# Patient Record
Sex: Female | Born: 1990 | Race: White | Hispanic: No | Marital: Married | Smoking: Former smoker
Health system: Southern US, Community
[De-identification: ages and names within clinical notes are randomized; demographics above are authoritative.]

## PROBLEM LIST (undated history)

## (undated) DIAGNOSIS — I10 Essential (primary) hypertension: Secondary | ICD-10-CM

## (undated) DIAGNOSIS — J45909 Unspecified asthma, uncomplicated: Secondary | ICD-10-CM

---
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
## 2011-01-29 ENCOUNTER — Emergency Department: Payer: Self-pay | Admitting: *Deleted

## 2012-04-25 ENCOUNTER — Emergency Department: Payer: Self-pay | Admitting: Emergency Medicine

## 2012-04-26 LAB — BASIC METABOLIC PANEL
Anion Gap: 7 (ref 7–16)
Co2: 27 mmol/L (ref 21–32)
Creatinine: 0.66 mg/dL (ref 0.60–1.30)
EGFR (African American): >60
EGFR (Non-African Amer.): >60
Glucose: 98 mg/dL (ref 65–99)
Sodium: 137 mmol/L (ref 136–145)

## 2012-09-16 IMAGING — US ABDOMEN ULTRASOUND LIMITED
1 series · 17 of 25 positions shown · non-contrast
Comparison: none

REASON FOR EXAM: RUQ pain, elevated lipase
COMMENTS:   Body Site: GB and Fossa, CBD, Head of Pancreas

PROCEDURE:     US  - US ABDOMEN LIMITED SURVEY  - January 29, 2011  [DATE]
RESULT:     Right upper quadrant abdominal ultrasound dated 01/29/2011.

[Series 1: abdomen ultrasound limited · 17 of 41 slices shown]
[im 1/41]
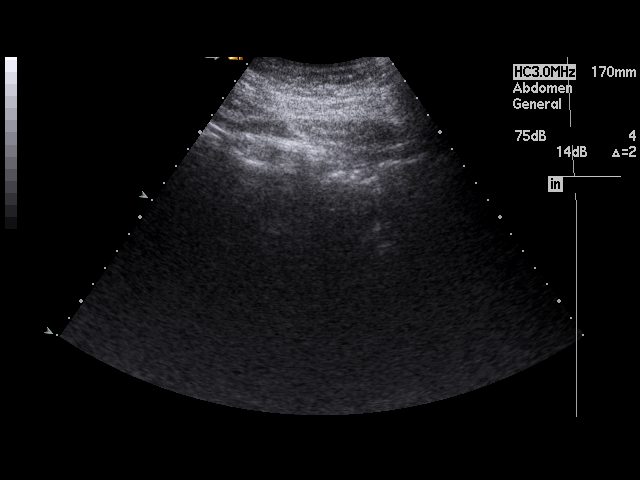
[im 4/41]
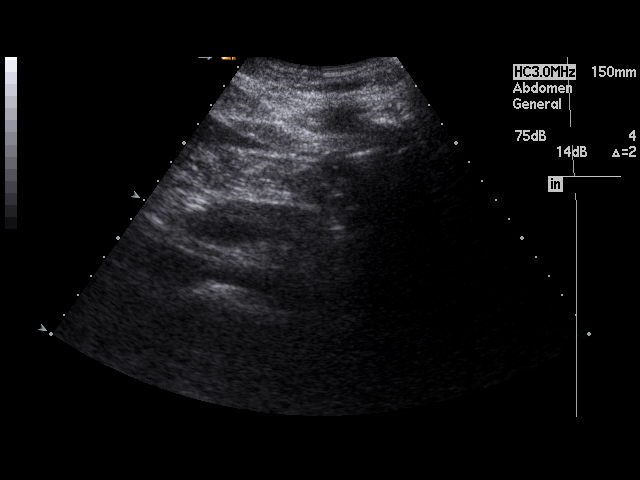
[im 6/41]
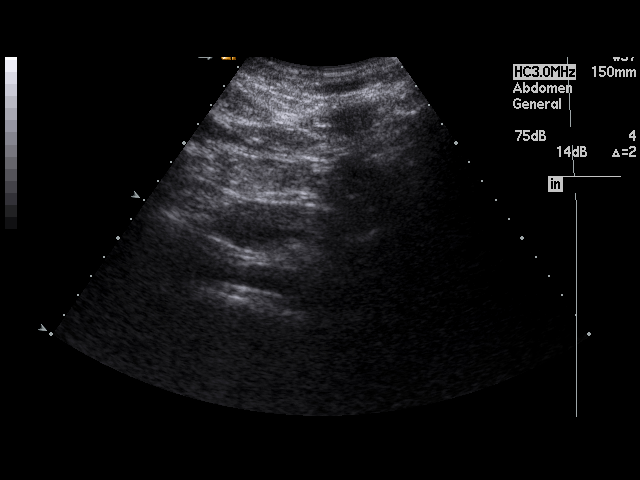
[im 9/41]
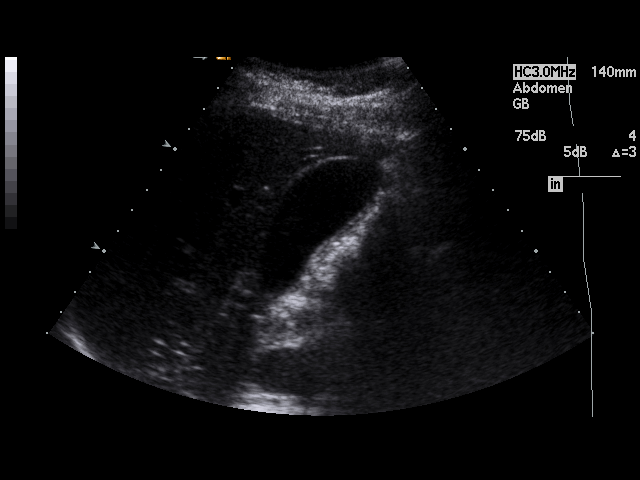
[im 11/41]
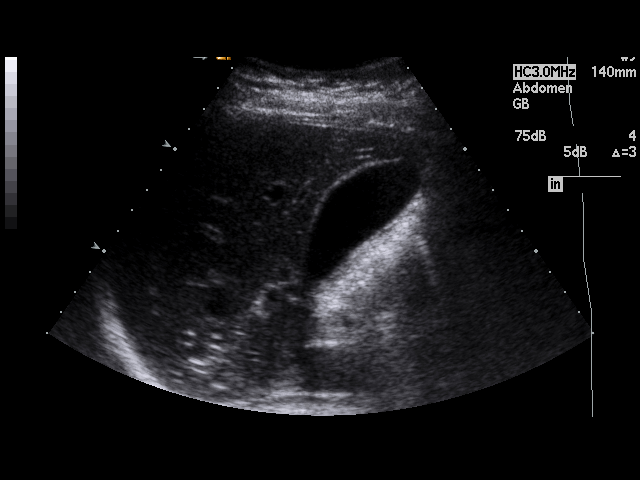
[im 14/41]
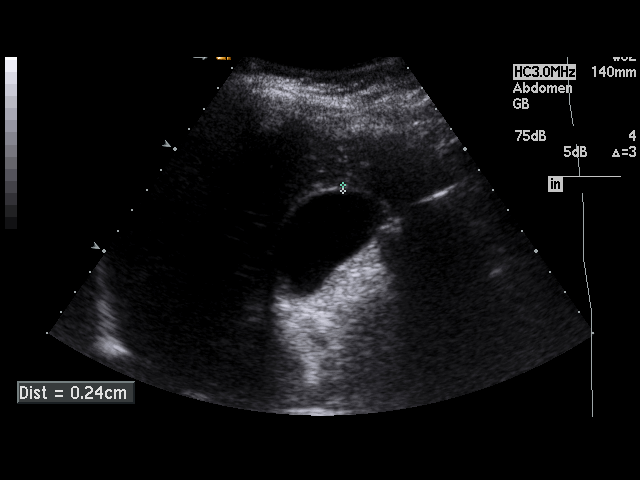
[im 16/41]
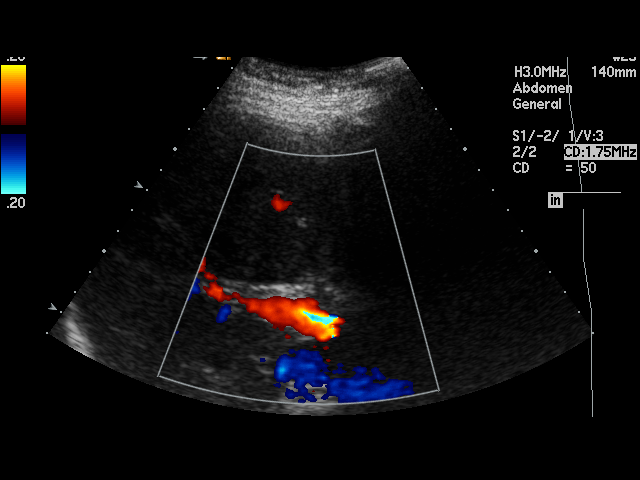
[im 19/41]
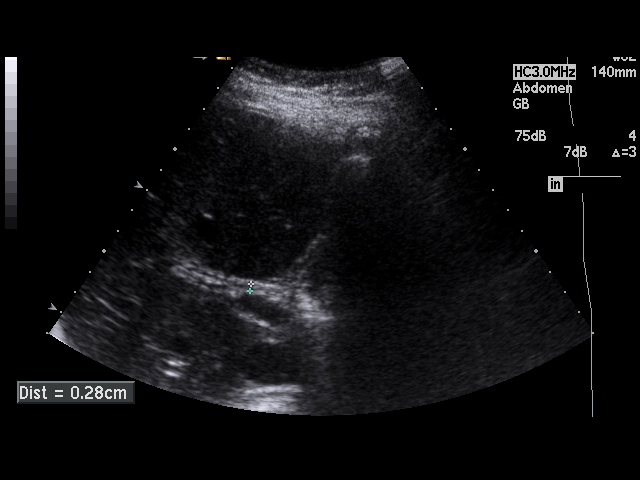
[im 21/41]
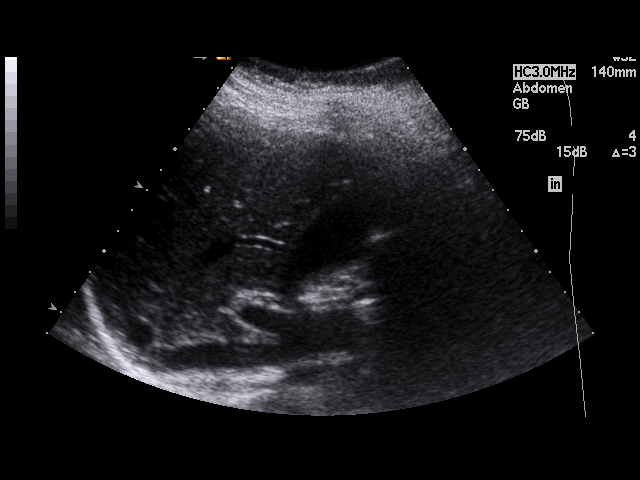
[im 22/41]
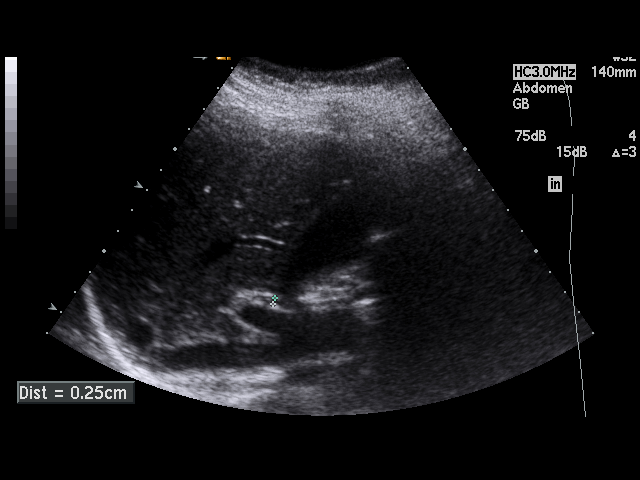
[im 26/41]
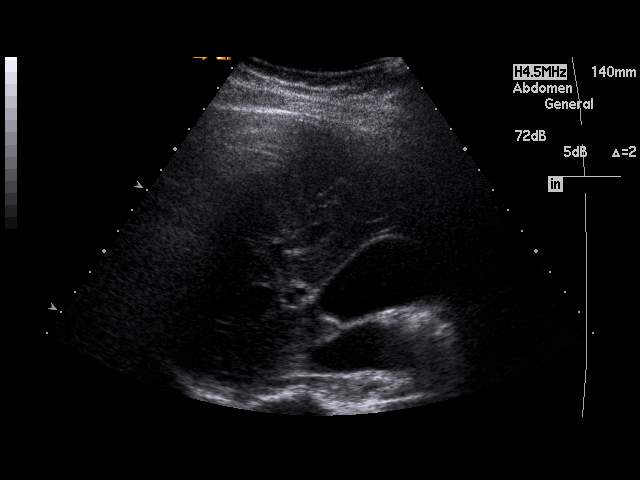
[im 27/41]
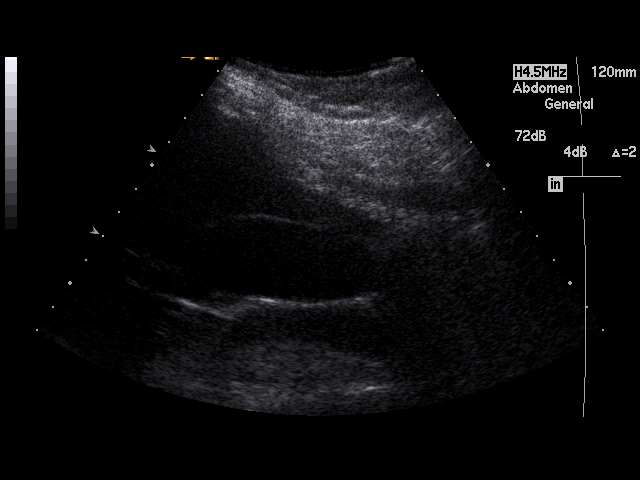
[im 31/41]
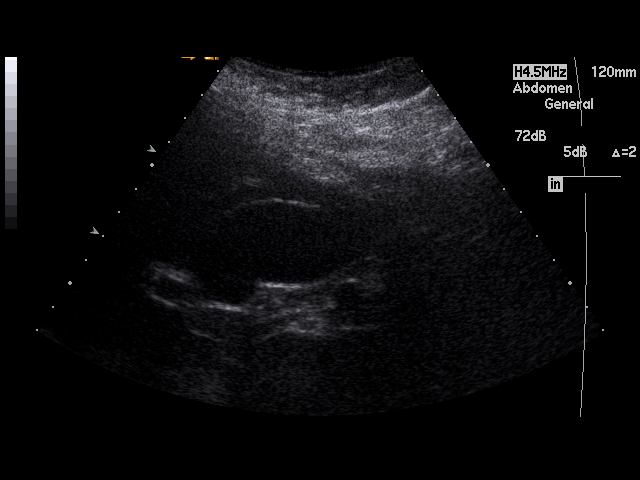
[im 32/41]
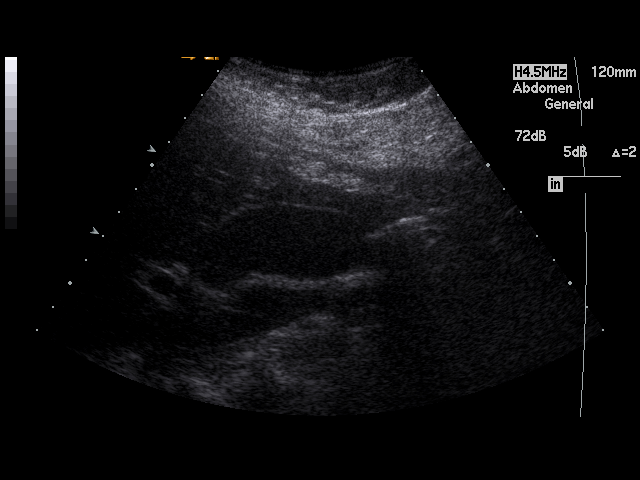
[im 36/41]
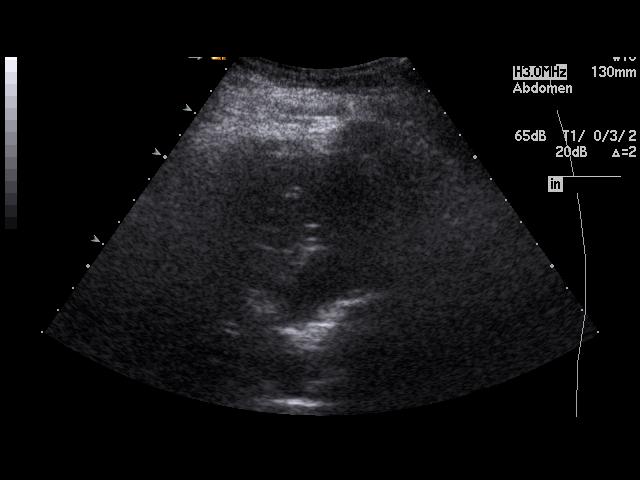
[im 37/41]
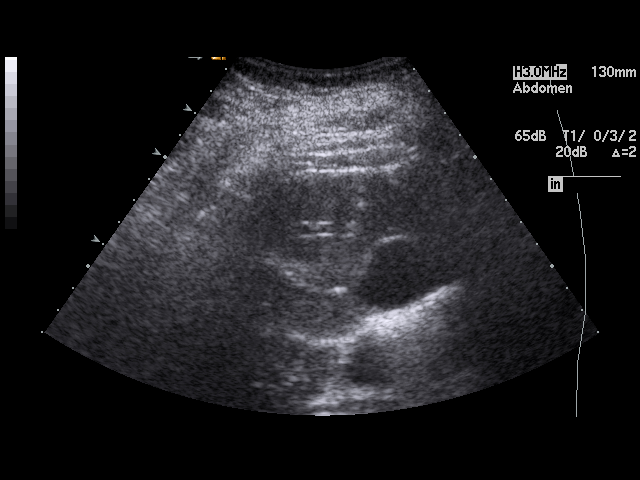
[im 41/41]
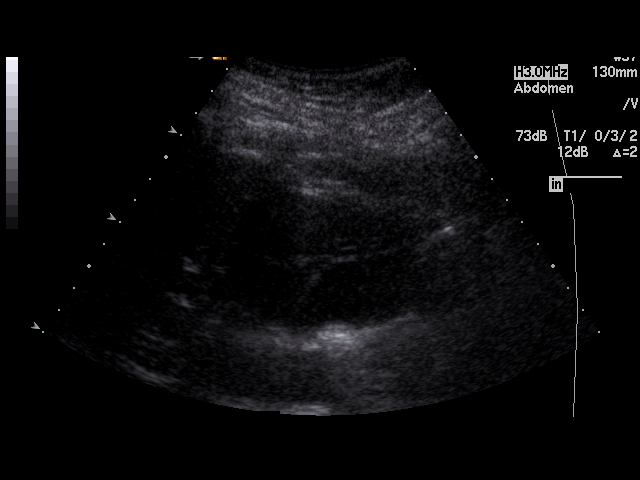

[17 of 25 positions shown; findings below may reference images not displayed]

FINDINGS: Gallbladder fossa demonstrates no evidence of pericholecystic
fluid, gallstones, nor sludging. There is no evidence of a sonographic
Murphy's sign. The gallbladder wall thickness is 2.4 mm and the common bile
duct measures 2.8 mm in diameter. The pancreas is partially visualized and
the head is unremarkable.
IMPRESSION: Unremarkable a right quadrant ultrasound.

## 2013-01-29 ENCOUNTER — Emergency Department: Payer: Self-pay | Admitting: Emergency Medicine

## 2013-01-29 LAB — CBC
HCT: 39.3 % (ref 35.0–47.0)
HGB: 13.3 g/dL (ref 12.0–16.0)
MCHC: 33.9 g/dL (ref 32.0–36.0)
Platelet: 312 10*3/uL (ref 150–440)
RBC: 4.6 10*6/uL (ref 3.80–5.20)
WBC: 9.7 10*3/uL (ref 3.6–11.0)

## 2013-03-11 ENCOUNTER — Emergency Department: Payer: Self-pay | Admitting: Emergency Medicine

## 2014-06-15 ENCOUNTER — Ambulatory Visit
Admit: 2014-06-15 | Disposition: A | Discharge status: Home / Self Care | Payer: Self-pay | Attending: Unknown Physician Specialty | Admitting: Unknown Physician Specialty

## 2014-06-29 ENCOUNTER — Emergency Department: Admit: 2014-06-29 | Disposition: A | Discharge status: Home / Self Care | Payer: Self-pay | Admitting: Student

## 2015-11-24 ENCOUNTER — Emergency Department
Admission: EM | Admit: 2015-11-24 | Discharge: 2015-11-24 | Disposition: A | Discharge status: Home / Self Care | Payer: Managed Care, Other (non HMO) | Attending: Emergency Medicine | Admitting: Emergency Medicine

## 2015-11-24 ENCOUNTER — Encounter: Payer: Self-pay | Admitting: Emergency Medicine

## 2015-11-24 DIAGNOSIS — Y999 Unspecified external cause status: Secondary | ICD-10-CM | POA: Insufficient documentation

## 2015-11-24 DIAGNOSIS — Y929 Unspecified place or not applicable: Secondary | ICD-10-CM | POA: Diagnosis not present

## 2015-11-24 DIAGNOSIS — Y939 Activity, unspecified: Secondary | ICD-10-CM | POA: Diagnosis not present

## 2015-11-24 DIAGNOSIS — W1809XA Striking against other object with subsequent fall, initial encounter: Secondary | ICD-10-CM | POA: Insufficient documentation

## 2015-11-24 DIAGNOSIS — S0990XA Unspecified injury of head, initial encounter: Secondary | ICD-10-CM | POA: Diagnosis not present

## 2015-11-24 NOTE — ED Triage Notes (Signed)
Patient reports fell and hit head on the wall.  Denies loss of consciousness.

## 2015-11-24 NOTE — ED Provider Notes (Signed)
[facility] Emergency Department Provider Note   ____________________________________________    I have reviewed the triage vital signs and the nursing notes.   HISTORY  Chief Complaint Head Injury     HPI Gwendolyn Barnes is a 25 y.o. female who presents with complaint of a head injury. Patient reports she was angry and lost her balance and hit the top of her head against the glass. She denies LOC. She reports she briefly felt "woozy". No change in vision. No focal deficits. No nausea or vomiting. This occurred nearly 5 hours ago. She reports she feels quite well currently   No past medical history on file.  There are no active problems to display for this patient.   No past surgical history on file.  Prior to Admission medications   Not on File     Allergies Review of patient's allergies indicates no known allergies.  No family history on file.  Social History Social History  Substance Use Topics  . Smoking status: Not on file  . Smokeless tobacco: Not on file  . Alcohol use Not on file  Denies alcohol use today, no smoking  Review of Systems  Constitutional: No dizziness  ENT: No neck pain   Gastrointestinal: No nausea, no vomiting.    Musculoskeletal: Negative for back pain. Skin: Negative for abrasion Neurological: Negative for headaches     ____________________________________________   PHYSICAL EXAM:  VITAL SIGNS: ED Triage Vitals  Enc Vitals Group     BP 11/24/15 2259 (!) 141/85     Pulse Rate 11/24/15 2258 (!) 108     Resp 11/24/15 2258 20     Temp 11/24/15 2258 98.5 F (36.9 C)     Temp Source 11/24/15 2258 Oral     SpO2 11/24/15 2258 100 %     Weight 11/24/15 2256 250 lb (113.4 kg)     Height 11/24/15 2256 5\' 5"  (1.651 m)     Head Circumference --      Peak Flow --      Pain Score 11/24/15 2256 8     Pain Loc --      Pain Edu? --      Excl. in GC? --      Constitutional: Alert and oriented. No  acute distress. Pleasant and interactive Eyes: Conjunctivae are normal. PERRLA, EOMI Head: Small area of swelling on the top of the head. No bony abnormality. Nose: No congestion/rhinnorhea. Mouth/Throat: Mucous membranes are moist.   Cardiovascular: Normal rate, regular rhythm.  Respiratory: Normal respiratory effort.  No retractions. Genitourinary: deferred Musculoskeletal: No cervical tenderness to palpation. Full range of motion of the neck. No vertebral tenderness to palpation, no pain in the back. Neurologic:  Normal speech and language. No gross focal neurologic deficits are appreciated.  Normal strength in all extremities. Skin:  Skin is warm, dry and intact. No rash noted.   ____________________________________________   LABS (all labs ordered are listed, but only abnormal results are displayed)  Labs Reviewed - No data to display ____________________________________________  EKG   ____________________________________________  RADIOLOGY  None ____________________________________________   PROCEDURES  Procedure(s) performed: No    Critical Care performed: No ____________________________________________   INITIAL IMPRESSION / ASSESSMENT AND PLAN / ED COURSE  Pertinent labs & imaging results that were available during my care of the patient were reviewed by me and considered in my medical decision making (see chart for details).  Patient well-appearing and in no distress. She appears in suffered  minor head injury with small contusion to the top of the scalp. No imaging necessary at this time. She is neurologically intact and well appearing. Return precautions discussed.   ____________________________________________   FINAL CLINICAL IMPRESSION(S) / ED DIAGNOSES  Final diagnoses:  Minor head injury, initial encounter      NEW MEDICATIONS STARTED DURING THIS VISIT:  There are no discharge medications for this patient.    Note:  This document was  prepared using Dragon voice recognition software and may include unintentional dictation errors.    Jene Everyobert Chyrl Elwell, MD 11/24/15 [id number]

## 2016-02-01 IMAGING — US ABDOMEN ULTRASOUND LIMITED
1 series · 14 of 25 positions shown · non-contrast
Comparison: 01/29/2011

CLINICAL DATA: Right upper quadrant pain for 3.5 weeks. History of
alcohol abuse.

EXAM:
US ABDOMEN LIMITED - RIGHT UPPER QUADRANT

[Series 1: abdomen ultrasound limited · 0.30mm/px · 14 of 44 slices shown]
[im 1/44]
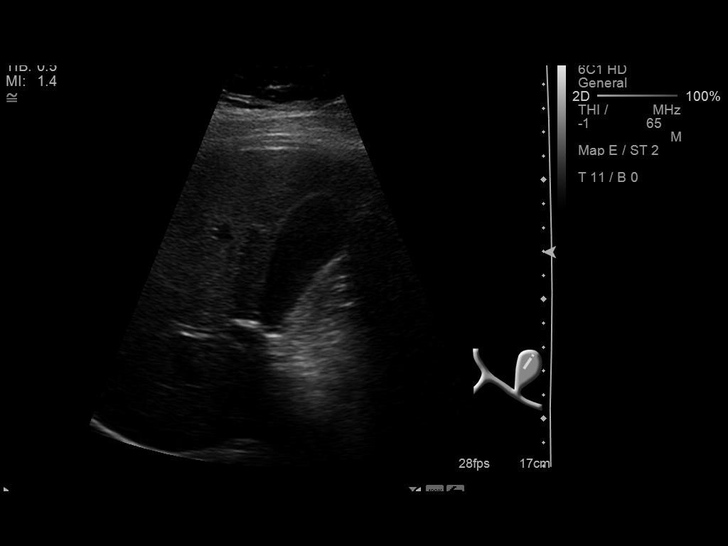
[im 4/44]
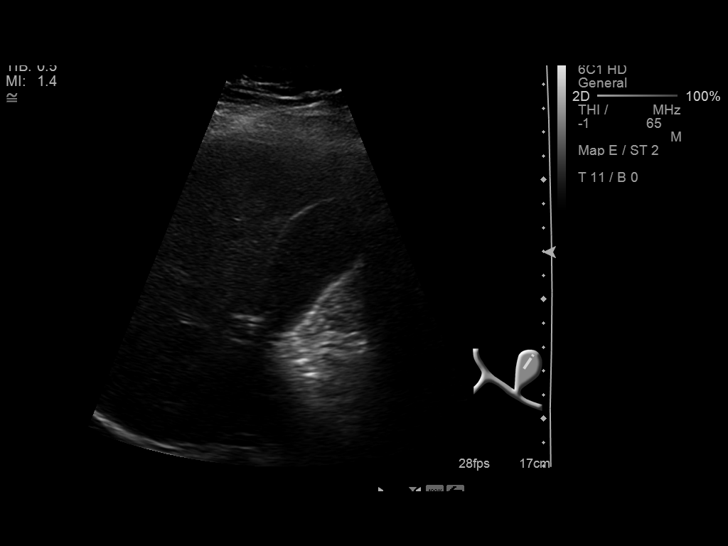
[im 8/44]
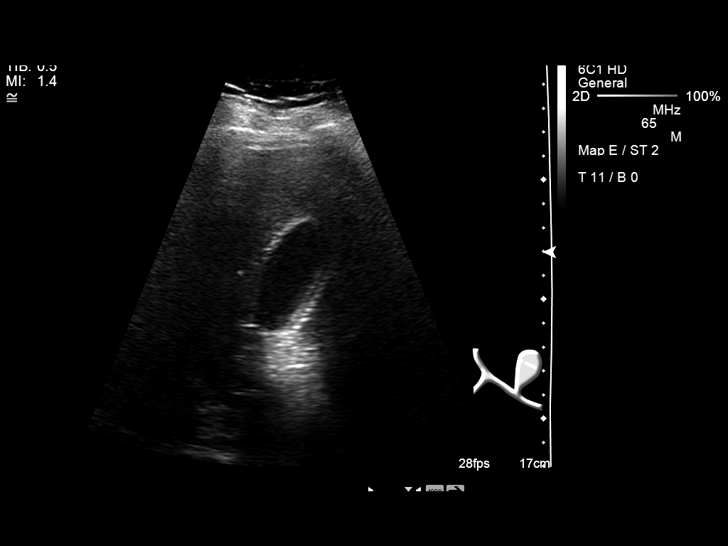
[im 11/44]
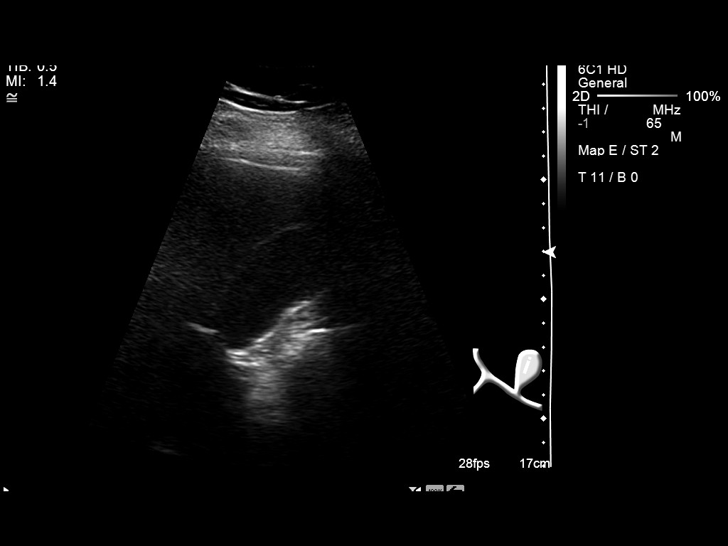
[im 15/44]
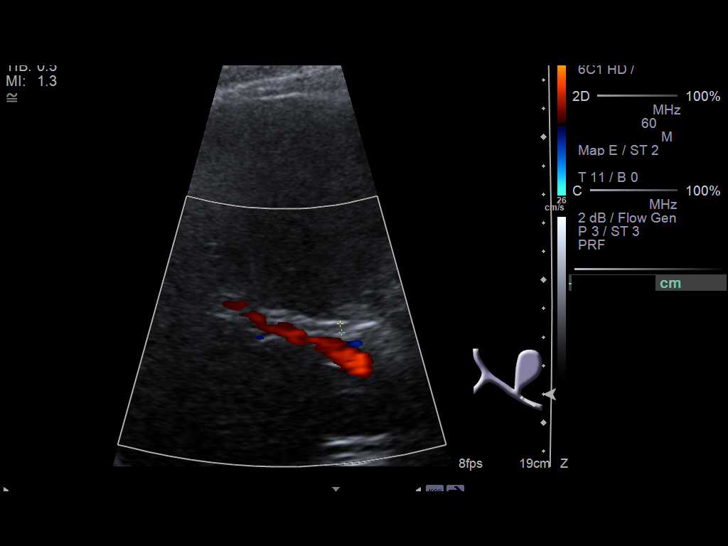
[im 17/44]
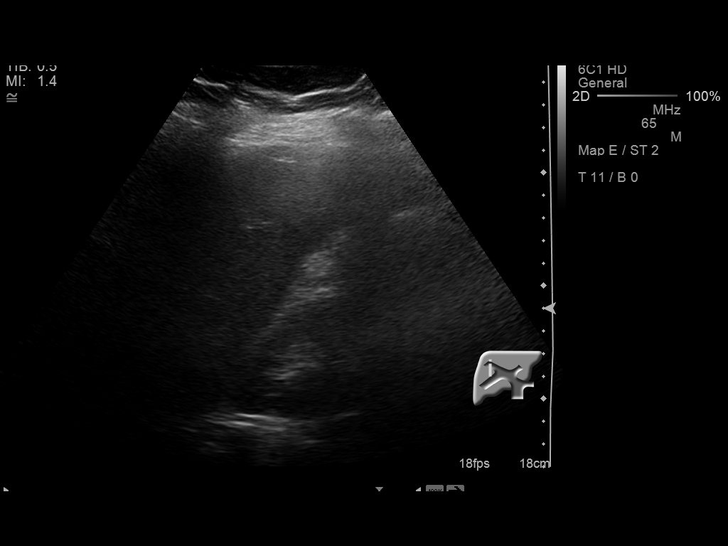
[im 20/44]
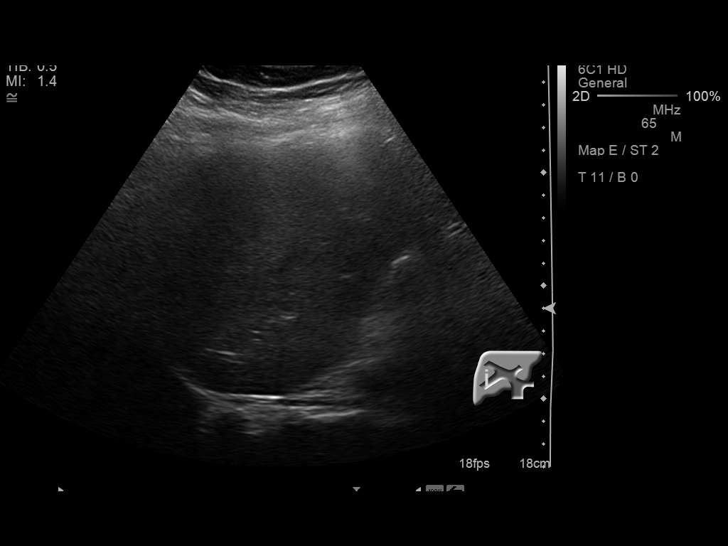
[im 24/44]
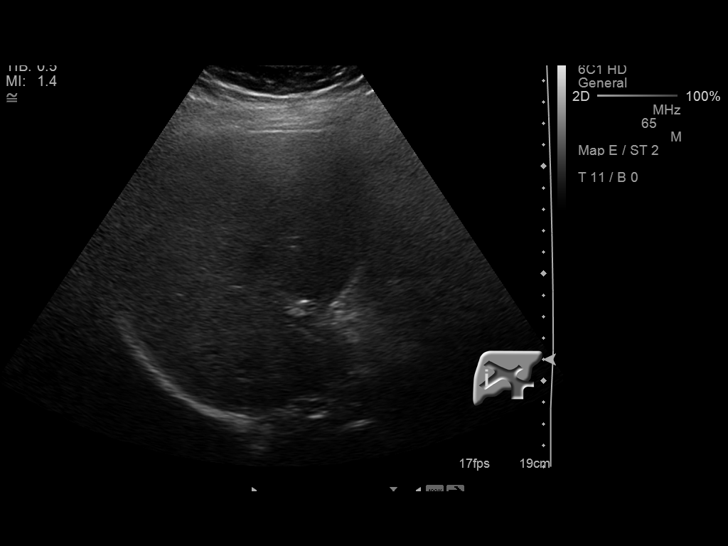
[im 27/44]
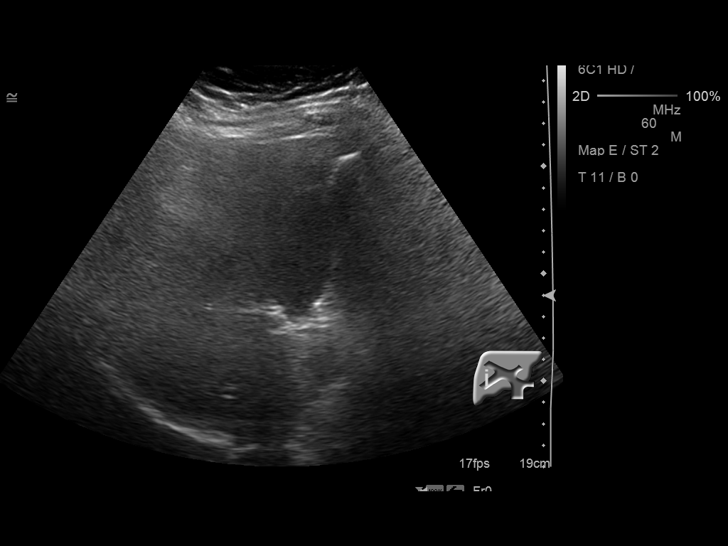
[im 29/44]
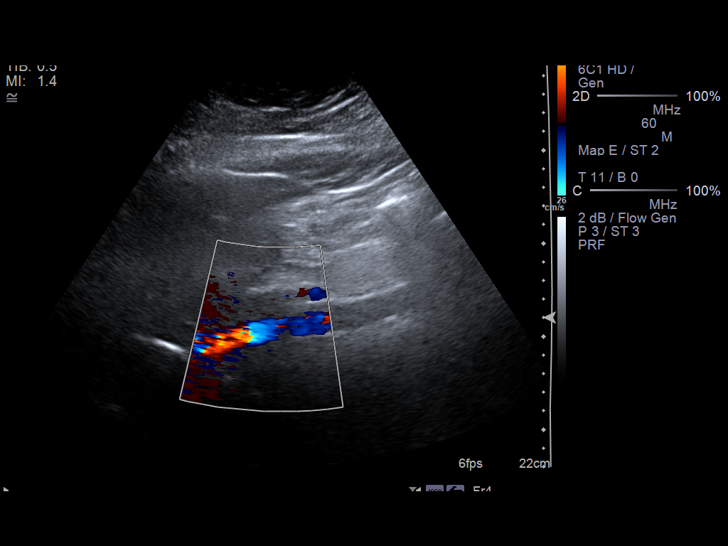
[im 33/44]
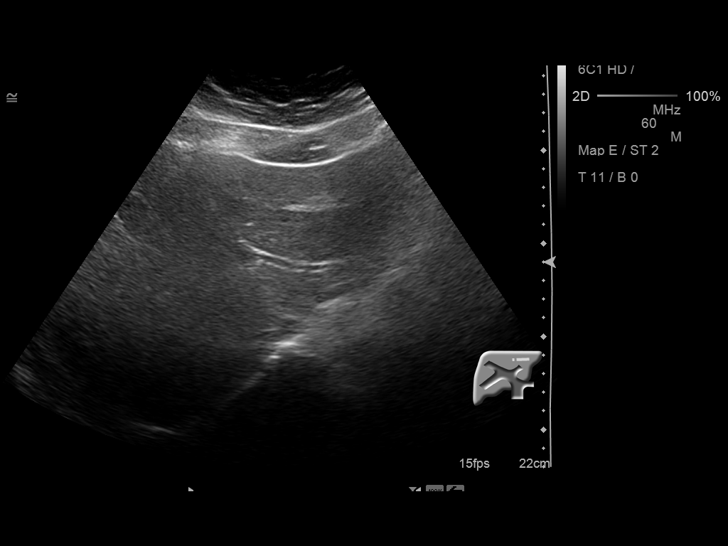
[im 36/44]
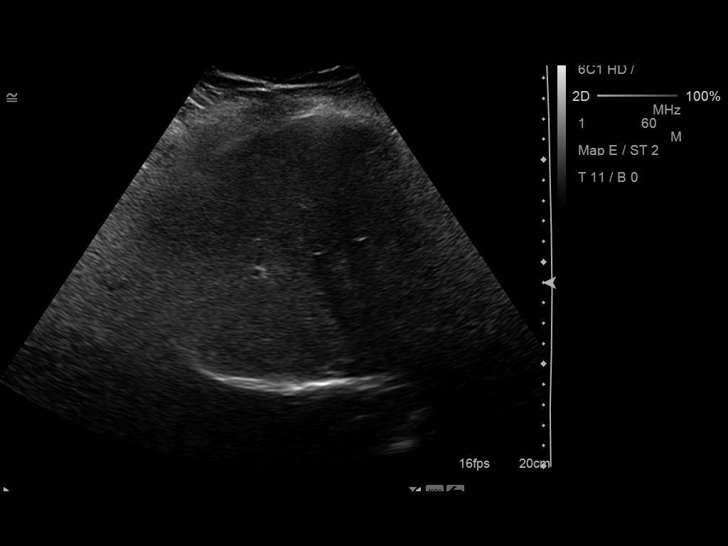
[im 40/44]
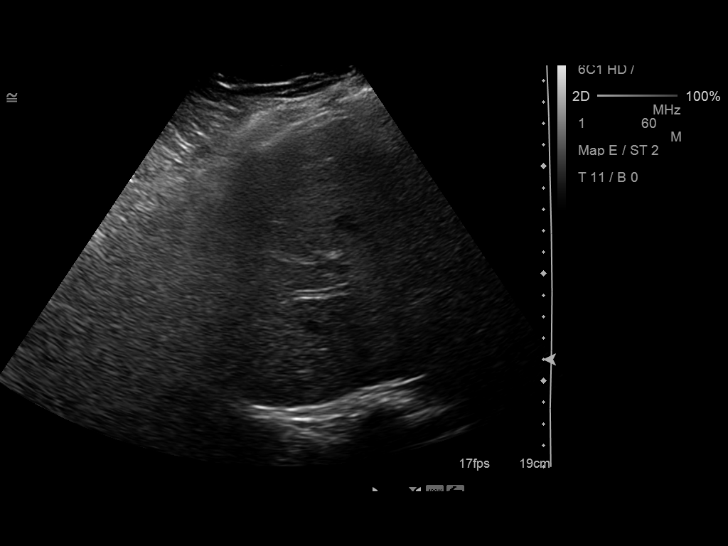
[im 44/44]
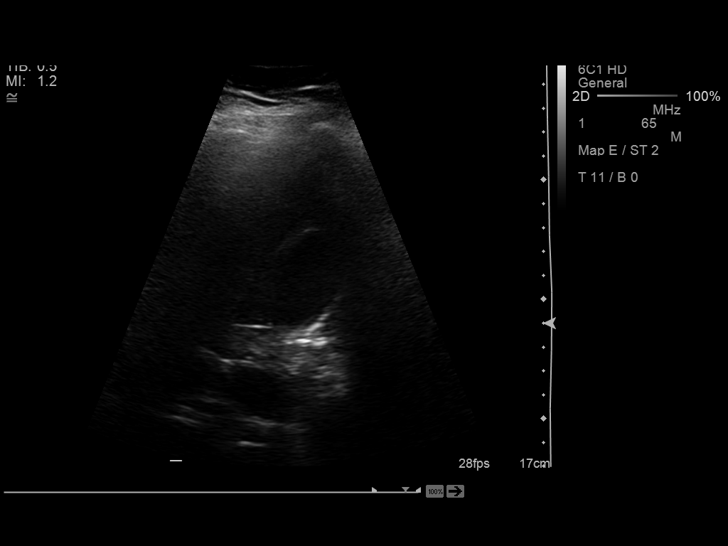

[14 of 25 positions shown; findings below may reference images not displayed]

FINDINGS: Examination was mildly limited by patient body habitus.

Gallbladder:

No gallstones or wall thickening visualized. No sonographic Murphy
sign noted.

Common bile duct:

Diameter: 3 mm

Liver:

No focal lesion identified. Within normal limits in parenchymal
echogenicity.
IMPRESSION: No abnormality identified.

## 2016-02-15 IMAGING — CR DG ANKLE COMPLETE 3+V*L*
1 series · 3 of 3 positions shown · non-contrast
Comparison: None.

CLINICAL DATA: Patient stepped down from a curb today and twisted
her left ankle. Patient has pain at the lateral malleolus with
swelling.

EXAM:
LEFT ANKLE COMPLETE - 3+ VIEW

[Series 1: ap · 0.17mm/px · 3 of 3 slices shown]
[im 1/3]
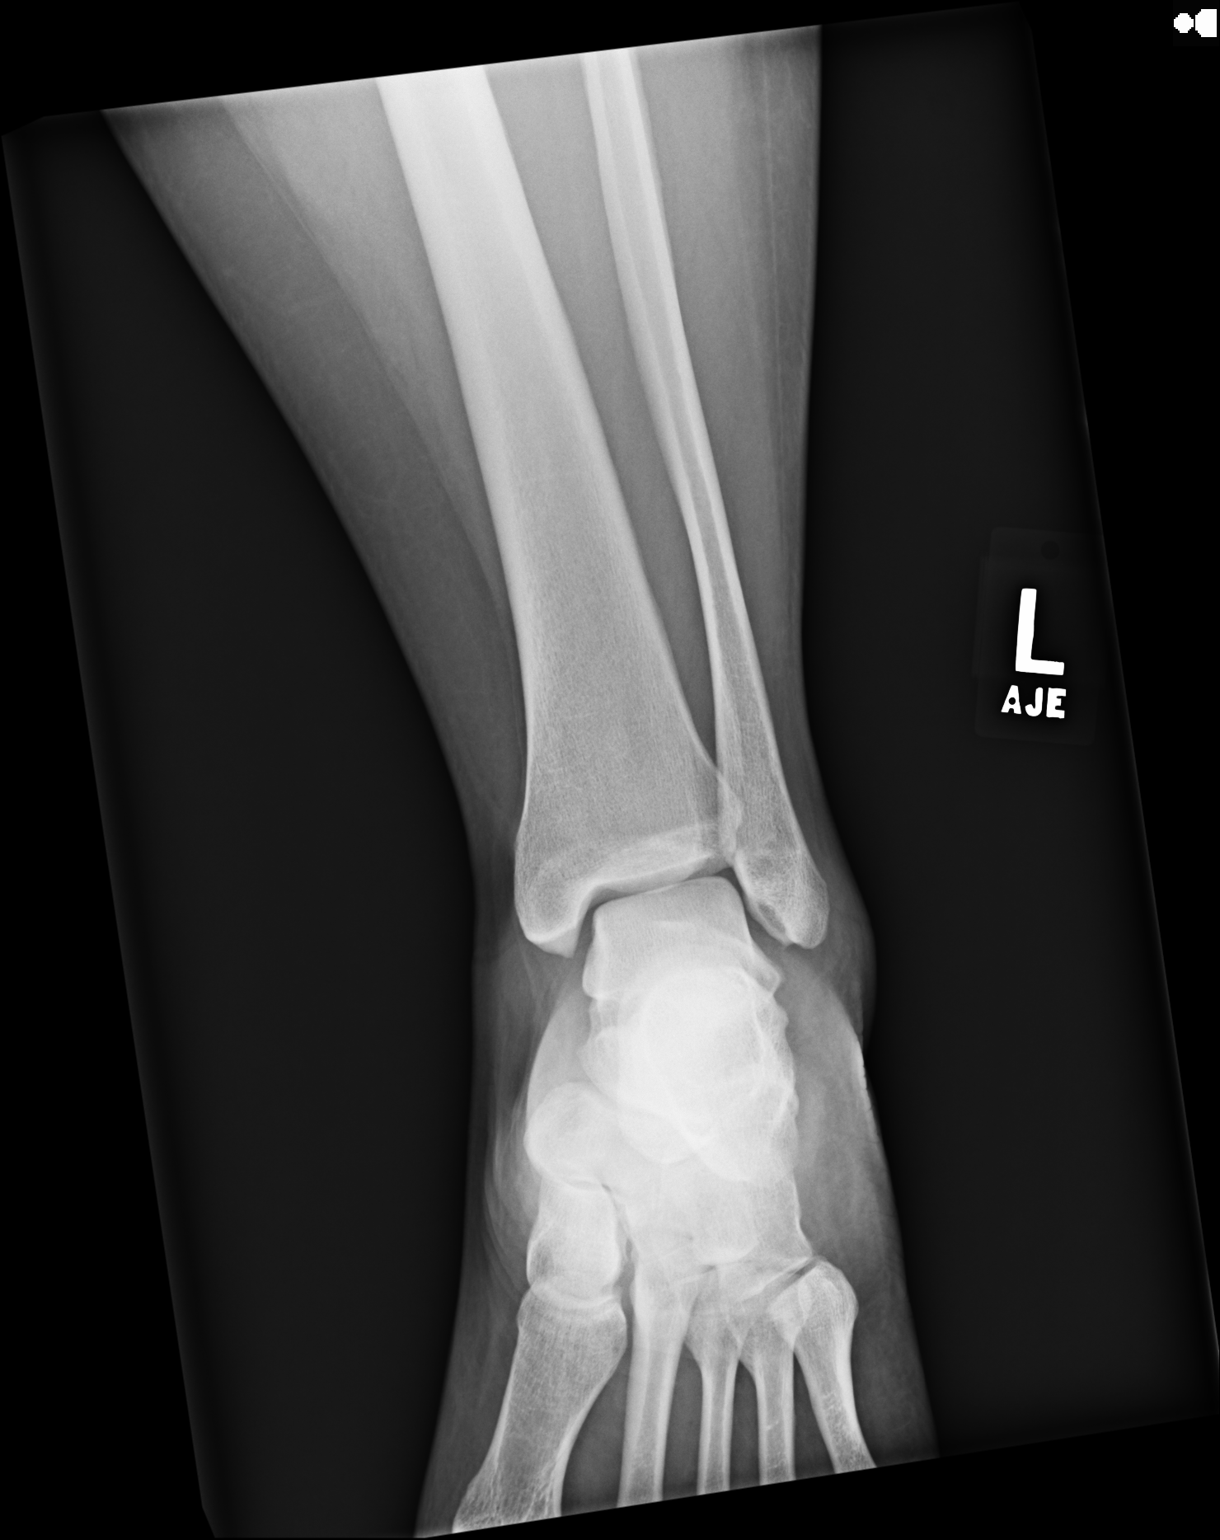
[im 2/3]
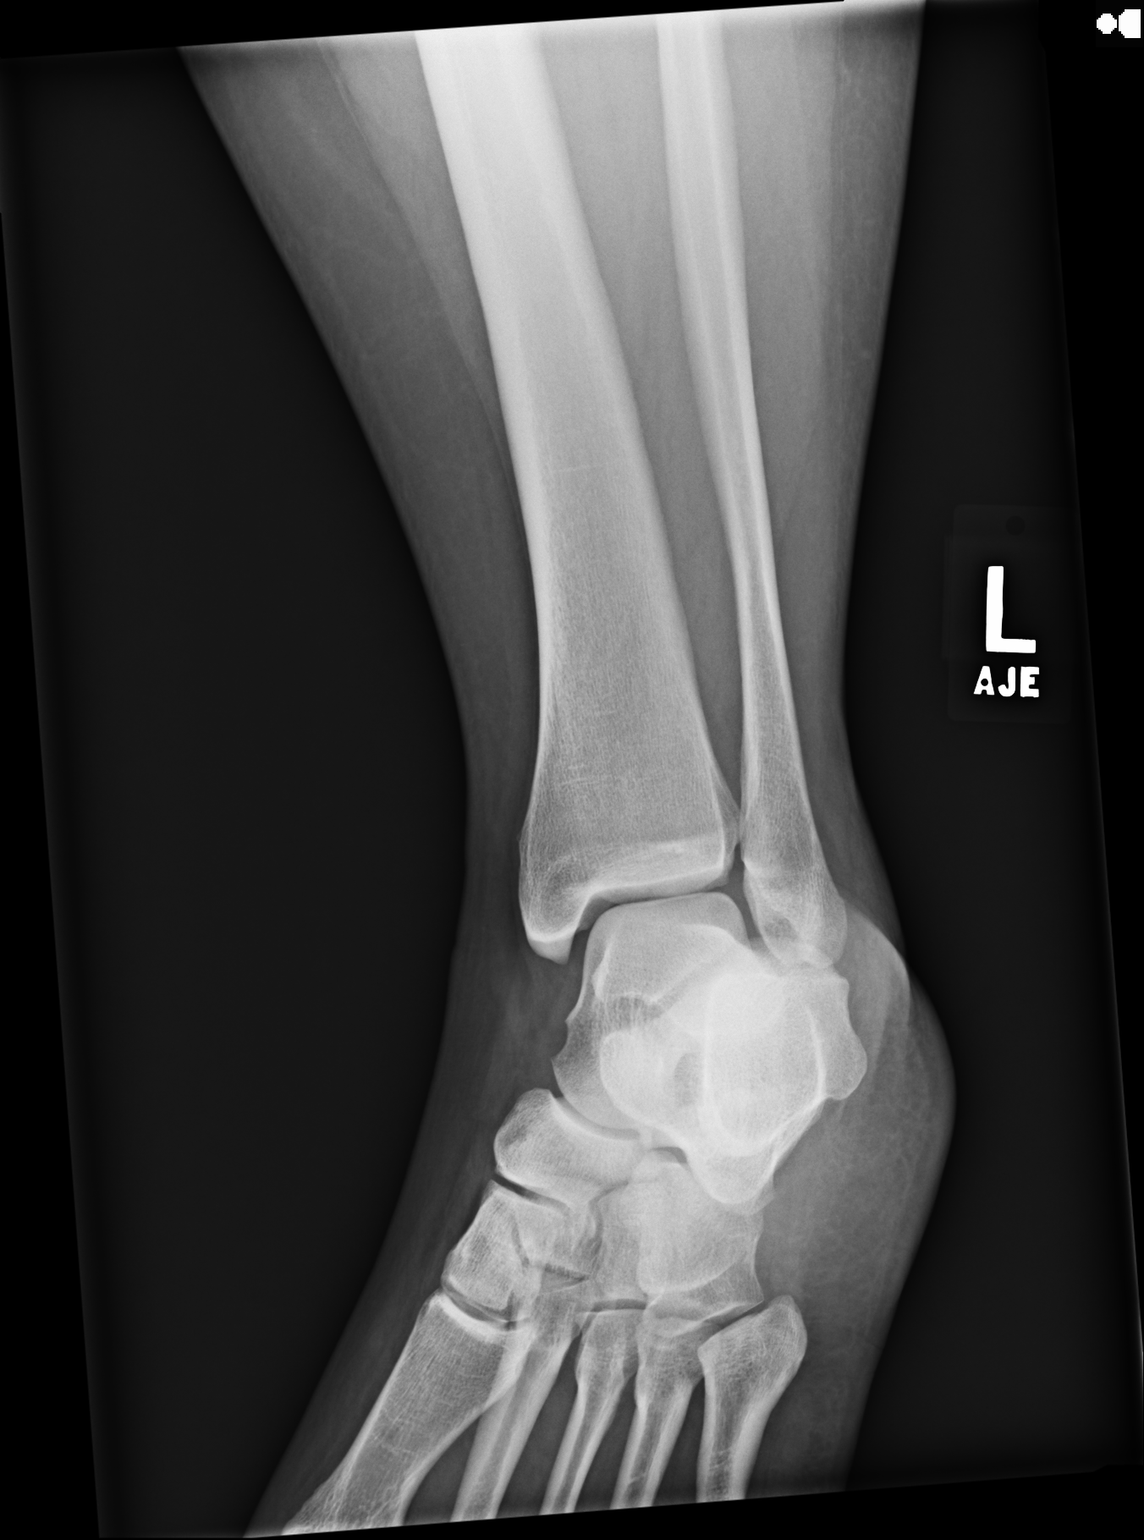
[im 3/3]
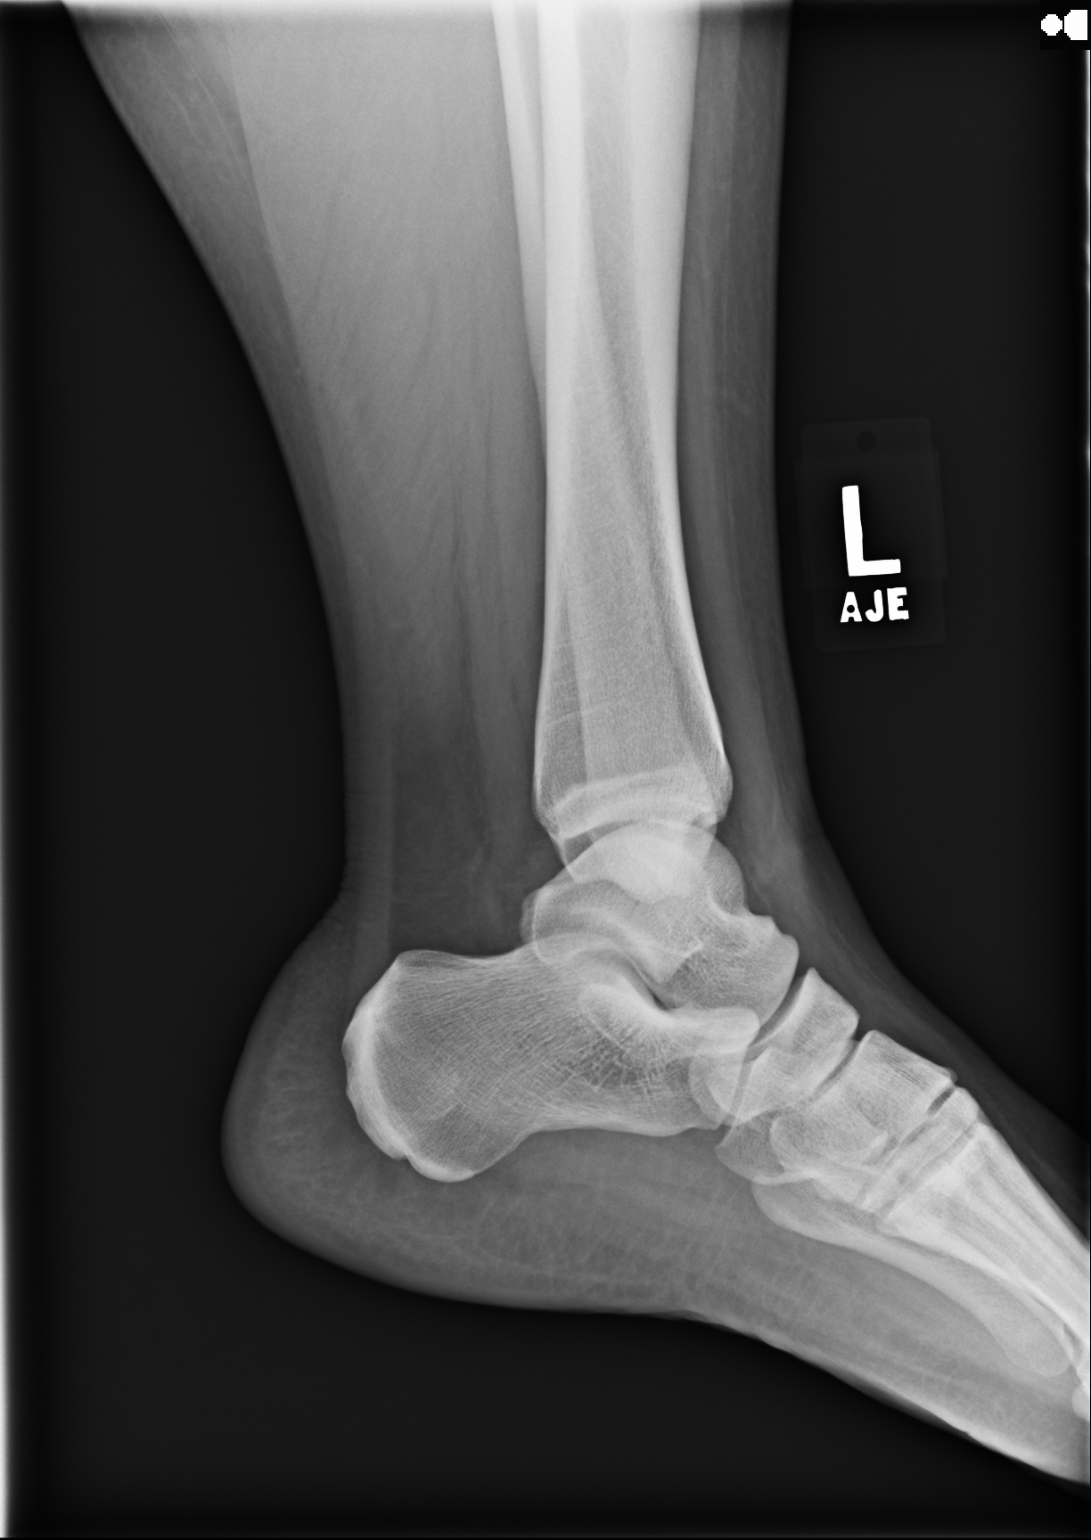

[3 of 3 positions shown; findings below may reference images not displayed]

FINDINGS: No fracture. Ankle mortise is normally spaced and aligned. No
arthropathic change.

There is soft tissue swelling that predominates laterally.
IMPRESSION: No fracture or dislocation.

## 2019-07-21 ENCOUNTER — Other Ambulatory Visit: Payer: Self-pay

## 2019-07-21 ENCOUNTER — Emergency Department
Admission: EM | Admit: 2019-07-21 | Discharge: 2019-07-21 | Disposition: A | Discharge status: Home / Self Care | Payer: PRIVATE HEALTH INSURANCE | Attending: Emergency Medicine | Admitting: Emergency Medicine

## 2019-07-21 ENCOUNTER — Emergency Department: Payer: PRIVATE HEALTH INSURANCE

## 2019-07-21 ENCOUNTER — Encounter: Payer: Self-pay | Admitting: Emergency Medicine

## 2019-07-21 DIAGNOSIS — J45901 Unspecified asthma with (acute) exacerbation: Secondary | ICD-10-CM | POA: Insufficient documentation

## 2019-07-21 DIAGNOSIS — F17200 Nicotine dependence, unspecified, uncomplicated: Secondary | ICD-10-CM | POA: Insufficient documentation

## 2019-07-21 DIAGNOSIS — J40 Bronchitis, not specified as acute or chronic: Secondary | ICD-10-CM

## 2019-07-21 LAB — BASIC METABOLIC PANEL
Anion gap: 10 (ref 5–15)
BUN: 9 mg/dL (ref 6–20)
CO2: 23 mmol/L (ref 22–32)
Calcium: 9.2 mg/dL (ref 8.9–10.3)
Chloride: 105 mmol/L (ref 98–111)
Creatinine, Ser: 0.66 mg/dL (ref 0.44–1.00)
GFR calc Af Amer: >60 mL/min (ref >60)
GFR calc non Af Amer: >60 mL/min (ref >60)
Glucose, Bld: 107 mg/dL — ABNORMAL HIGH (ref 70–99)
Potassium: 3.7 mmol/L (ref 3.5–5.1)
Sodium: 138 mmol/L (ref 135–145)

## 2019-07-21 LAB — CBC
HCT: 40.8 % (ref 36.0–46.0)
Hemoglobin: 14.2 g/dL (ref 12.0–15.0)
MCH: 29.8 pg (ref 26.0–34.0)
MCHC: 34.8 g/dL (ref 30.0–36.0)
MCV: 85.7 fL (ref 80.0–100.0)
Platelets: 325 10*3/uL (ref 150–400)
RBC: 4.76 MIL/uL (ref 3.87–5.11)
RDW: 12.1 % (ref 11.5–15.5)
WBC: 15.4 10*3/uL — ABNORMAL HIGH (ref 4.0–10.5)
nRBC: 0 % (ref 0.0–0.2)

## 2019-07-21 LAB — TROPONIN I (HIGH SENSITIVITY): Troponin I (High Sensitivity): <2 ng/L (ref <18)

## 2019-07-21 MED ORDER — IPRATROPIUM-ALBUTEROL 0.5-2.5 (3) MG/3ML IN SOLN
RESPIRATORY_TRACT | Status: AC
  Administered 2019-07-21: 9 mL via RESPIRATORY_TRACT
  Filled 2019-07-21: qty 9

## 2019-07-21 MED ORDER — ONDANSETRON HCL 4 MG/2ML IJ SOLN
INTRAMUSCULAR | Status: AC
  Administered 2019-07-21: 4 mg
  Filled 2019-07-21: qty 2

## 2019-07-21 MED ORDER — MAGNESIUM SULFATE 2 GM/50ML IV SOLN
2.0000 g | Freq: Once | INTRAVENOUS | Status: AC
  Administered 2019-07-21: 2 g via INTRAVENOUS
  Filled 2019-07-21: qty 50

## 2019-07-21 MED ORDER — PREDNISONE 20 MG PO TABS
60.0000 mg | ORAL_TABLET | Freq: Once | ORAL | Status: AC
  Administered 2019-07-21: 60 mg via ORAL
  Filled 2019-07-21: qty 3

## 2019-07-21 MED ORDER — ALBUTEROL SULFATE (2.5 MG/3ML) 0.083% IN NEBU
2.5000 mg | INHALATION_SOLUTION | Freq: Once | RESPIRATORY_TRACT | Status: AC
  Administered 2019-07-21: 2.5 mg via RESPIRATORY_TRACT
  Filled 2019-07-21: qty 3

## 2019-07-21 MED ORDER — ALBUTEROL SULFATE HFA 108 (90 BASE) MCG/ACT IN AERS: 2.0000 | INHALATION_SPRAY | Freq: Four times a day (QID) | RESPIRATORY_TRACT | 0 refills | Status: AC | PRN

## 2019-07-21 MED ORDER — IPRATROPIUM-ALBUTEROL 0.5-2.5 (3) MG/3ML IN SOLN: 9.0000 mL | Freq: Once | RESPIRATORY_TRACT | Status: AC

## 2019-07-21 MED ORDER — PREDNISONE 20 MG PO TABS: 60.0000 mg | ORAL_TABLET | Freq: Every day | ORAL | 0 refills | Status: AC

## 2019-07-21 NOTE — ED Provider Notes (Signed)
[facility] Emergency Department Provider Note   ____________________________________________   First MD Initiated Contact with Patient 07/21/19 1003     (approximate)  I have reviewed the triage vital signs and the nursing notes.   HISTORY  Chief Complaint Emesis and Shortness of Breath    HPI Gwendolyn Barnes is a 29 y.o. female with no significant past medical history who presents to the ED complaining of shortness of breath.  Patient reports that she has had increased difficulty breathing over the past couple of days as well as a productive cough with greenish sputum.  She denies any fevers but has had some vomiting earlier today, denies any abdominal pain or diarrhea.  She denies any history of asthma, but does states she has been told she has bronchospasm in the past on a telehealth visit.  She has not been taking anything for her symptoms at home.  She denies any sick contacts.        History reviewed. No pertinent past medical history.  There are no problems to display for this patient.   History reviewed. No pertinent surgical history.  Prior to Admission medications   Medication Sig Start Date End Date Taking? Authorizing Provider  albuterol (VENTOLIN HFA) 108 (90 Base) MCG/ACT inhaler Inhale 2 puffs into the lungs every 6 (six) hours as needed for wheezing or shortness of breath. 07/21/19   Chesley Noon, MD  predniSONE (DELTASONE) 20 MG tablet Take 3 tablets (60 mg total) by mouth daily for 5 days. 07/21/19 07/26/19  Chesley Noon, MD    Allergies Patient has no known allergies.  History reviewed. No pertinent family history.  Social History Social History   Tobacco Use  . Smoking status: Current Every Day Smoker  . Smokeless tobacco: Never Used  Substance Use Topics  . Alcohol use: Never  . Drug use: Never    Review of Systems  Constitutional: No fever/chills Eyes: No visual changes. ENT: No sore throat. Cardiovascular:  Denies chest pain. Respiratory: Positive for shortness of breath. Gastrointestinal: No abdominal pain.  Positive for nausea and vomiting.  No diarrhea.  No constipation. Genitourinary: Negative for dysuria. Musculoskeletal: Negative for back pain. Skin: Negative for rash. Neurological: Negative for headaches, focal weakness or numbness.  ____________________________________________   PHYSICAL EXAM:  VITAL SIGNS: ED Triage Vitals  Enc Vitals Group     BP 07/21/19 0953 (!) 137/94     Pulse Rate 07/21/19 0953 (!) 107     Resp 07/21/19 0953 (!) 40     Temp --      Temp src --      SpO2 07/21/19 0953 99 %     Weight 07/21/19 0953 250 lb (113.4 kg)     Height 07/21/19 0953 5\' 5"  (1.651 m)     Head Circumference --      Peak Flow --      Pain Score 07/21/19 0952 8     Pain Loc --      Pain Edu? --      Excl. in GC? --     Constitutional: Alert and oriented. Eyes: Conjunctivae are normal. Head: Atraumatic. Nose: No congestion/rhinnorhea. Mouth/Throat: Mucous membranes are moist. Neck: Normal ROM Cardiovascular: Normal rate, regular rhythm. Grossly normal heart sounds. Respiratory: Tachypneic with increased respiratory effort.  No retractions. Lungs with inspiratory and expiratory wheezing throughout. Gastrointestinal: Soft and nontender. No distention. Genitourinary: deferred Musculoskeletal: No lower extremity tenderness nor edema. Neurologic:  Normal speech and language. No gross focal  neurologic deficits are appreciated. Skin:  Skin is warm, dry and intact. No rash noted. Psychiatric: Mood and affect are normal. Speech and behavior are normal.  ____________________________________________   LABS (all labs ordered are listed, but only abnormal results are displayed)  Labs Reviewed  BASIC METABOLIC PANEL - Abnormal; Notable for the following components:      Result Value   Glucose, Bld 107 (*)    All other components within normal limits  CBC - Abnormal; Notable for  the following components:   WBC 15.4 (*)    All other components within normal limits  POC URINE PREG, ED  TROPONIN I (HIGH SENSITIVITY)   ____________________________________________  EKG  ED ECG REPORT I, Blake Divine, the attending physician, personally viewed and interpreted this ECG.   Date: 07/21/2019  EKG Time: 10:25  Rate: 100  Rhythm: sinus tachycardia  Axis: Normal  Intervals:none  ST&T Change: None  PROCEDURES  Procedure(s) performed (including Critical Care):  Procedures   ____________________________________________   INITIAL IMPRESSION / ASSESSMENT AND PLAN / ED COURSE       29 year old female presents to the ED with increased difficulty breathing as well as productive cough and vomiting over the past couple of days.  She arrives in some moderate respiratory distress with significant tachypnea, increased respiratory effort, and wheezing throughout.  While we cannot completely rule out COVID-19, she does seem to be having a significant asthma exacerbation and benefits of nebulized breathing treatments outweigh the risks at this time.  We will also treat with steroids as well as magnesium.  Plan to check chest x-ray, labs, and EKG.  Chest x-ray negative for acute process, EKG shows no evidence of arrhythmia or ischemia.  Troponin is also within normal limits and I doubt ACS or PE.  Remainder of lab work is unremarkable.  Patient reports significant improvement in her difficulty breathing, now has minimal expiratory wheezing.  She was briefly noted to be hypoxic while sleeping, likely has an element of sleep apnea as O2 sats rapidly improve to 99% when she is awakened.  Her tachypnea has resolved and she is appropriate for discharge home, will start on steroids and provide prescription with inhaler.  She was counseled to follow-up with PCP and return to the ED for new or worsening symptoms.  Patient agrees with plan.       ____________________________________________   FINAL CLINICAL IMPRESSION(S) / ED DIAGNOSES  Final diagnoses:  Exacerbation of asthma, unspecified asthma severity, unspecified whether persistent  Bronchitis     ED Discharge Orders         Ordered    albuterol (VENTOLIN HFA) 108 (90 Base) MCG/ACT inhaler  Every 6 hours PRN    Note to Pharmacy: Please supply with spacer   07/21/19 1257    predniSONE (DELTASONE) 20 MG tablet  Daily     07/21/19 1257           Note:  This document was prepared using Dragon voice recognition software and may include unintentional dictation errors.   Blake Divine, MD 07/21/19 1606

## 2019-07-21 NOTE — ED Notes (Signed)
UTO oral temp r/t mouth breathing

## 2019-07-21 NOTE — ED Triage Notes (Signed)
Pt here for vomiting and SHOB.  Has had paresthesia.  C/o nausea, decreased appetite.  Expiratory wheezing noted.  Increased RR, encouraged pt to try and slow breathing.  Green/yellow productive cough.

## 2019-07-21 NOTE — ED Notes (Addendum)
Pt given breathing tx per EDP order. Pt placed on 2L O2 after treatment d/t O2 sat decreasing to 86%. EDP aware.

## 2019-07-21 NOTE — ED Notes (Addendum)
Pt visualized resting, SO bedside. O2 sats 89% on RA while sleeping. Pts O2 sat increases when awakened.

## 2019-07-21 NOTE — ED Notes (Signed)
Pt gave this RN verbal consent for discharge d/t topaz pad not working.

## 2020-07-20 ENCOUNTER — Other Ambulatory Visit: Payer: Self-pay

## 2020-07-20 ENCOUNTER — Emergency Department
Admission: EM | Admit: 2020-07-20 | Discharge: 2020-07-20 | Disposition: A | Discharge status: Home / Self Care | Payer: Worker's Compensation | Attending: Emergency Medicine | Admitting: Emergency Medicine

## 2020-07-20 DIAGNOSIS — S81831A Puncture wound without foreign body, right lower leg, initial encounter: Secondary | ICD-10-CM | POA: Diagnosis not present

## 2020-07-20 DIAGNOSIS — S41132A Puncture wound without foreign body of left upper arm, initial encounter: Secondary | ICD-10-CM | POA: Insufficient documentation

## 2020-07-20 DIAGNOSIS — F17200 Nicotine dependence, unspecified, uncomplicated: Secondary | ICD-10-CM | POA: Diagnosis not present

## 2020-07-20 DIAGNOSIS — Y990 Civilian activity done for income or pay: Secondary | ICD-10-CM | POA: Insufficient documentation

## 2020-07-20 DIAGNOSIS — Z23 Encounter for immunization: Secondary | ICD-10-CM | POA: Diagnosis not present

## 2020-07-20 DIAGNOSIS — W540XXA Bitten by dog, initial encounter: Secondary | ICD-10-CM | POA: Diagnosis not present

## 2020-07-20 DIAGNOSIS — S8991XA Unspecified injury of right lower leg, initial encounter: Secondary | ICD-10-CM | POA: Diagnosis present

## 2020-07-20 MED ORDER — TETANUS-DIPHTH-ACELL PERTUSSIS 5-2.5-18.5 LF-MCG/0.5 IM SUSY
0.5000 mL | PREFILLED_SYRINGE | Freq: Once | INTRAMUSCULAR | Status: AC
  Administered 2020-07-20: 0.5 mL via INTRAMUSCULAR
  Filled 2020-07-20: qty 0.5

## 2020-07-20 MED ORDER — AMOXICILLIN-POT CLAVULANATE 875-125 MG PO TABS: 1.0000 | ORAL_TABLET | Freq: Two times a day (BID) | ORAL | 0 refills | Status: AC

## 2020-07-20 MED ORDER — AMOXICILLIN-POT CLAVULANATE 875-125 MG PO TABS
1.0000 | ORAL_TABLET | Freq: Once | ORAL | Status: AC
  Administered 2020-07-20: 1 via ORAL
  Filled 2020-07-20: qty 1

## 2020-07-20 MED ORDER — BACITRACIN-NEOMYCIN-POLYMYXIN 400-5-5000 EX OINT
TOPICAL_OINTMENT | Freq: Once | CUTANEOUS | Status: AC
  Filled 2020-07-20: qty 1

## 2020-07-20 NOTE — ED Notes (Signed)
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

## 2020-07-20 NOTE — ED Triage Notes (Signed)
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

## 2020-07-20 NOTE — ED Provider Notes (Signed)
[facility] Emergency Department Provider Note ____________________________________________  Time seen: 1900  I have reviewed the triage vital signs and the nursing notes.  HISTORY  Chief Complaint  Animal Bite   HPI Gwendolyn Barnes is a 30 y.o. female presents to the ER today with complaint of dog scratch/bite to right lower leg, left posterior thigh and left upper arm.  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  The sheriff's department was called and this will be reported to animal control services.  She reports the owner told her the dog was UTD on its vaccinations.  She reports EMS came, cleansed and wrapped her wounds.  She did not take any medications PTA.  She has no idea when her last tetanus vaccination was.  History reviewed. No pertinent past medical history.  There are no problems to display for this patient.   History reviewed. No pertinent surgical history.  Prior to Admission medications   Medication Sig Start Date End Date Taking? Authorizing Provider  amoxicillin-clavulanate (AUGMENTIN) 875-125 MG tablet Take 1 tablet by mouth every 12 (twelve) hours for 10 days. 07/20/20 07/30/20 Yes Deundre Thong, Salvadore Oxford, NP  albuterol (VENTOLIN HFA) 108 (90 Base) MCG/ACT inhaler Inhale 2 puffs into the lungs every 6 (six) hours as needed for wheezing or shortness of breath. 07/21/19   Chesley Noon, MD    Allergies Patient has no known allergies.  No family history on file.  Social History Social History   Tobacco Use  . Smoking status: Current Every Day Smoker  . Smokeless tobacco: Never Used  Substance Use Topics  . Alcohol use: Never  . Drug use: Never    Review of Systems  Constitutional: Negative for fever, chills or body aches. Cardiovascular: Negative for chest pain or chest tightness. Respiratory: Negative for cough or shortness of breath. Skin:  Positive for dog bite/scratch to right lower leg, left posterior thigh and left upper arm. Neurological: Negative for focal weakness, tingling or numbness. ____________________________________________  PHYSICAL EXAM:  VITAL SIGNS: ED Triage Vitals  Enc Vitals Group     BP 07/20/20 1742 138/90     Pulse Rate 07/20/20 1742 82     Resp 07/20/20 1742 18     Temp 07/20/20 1742 98.3 F (36.8 C)     Temp Source 07/20/20 1742 Oral     SpO2 07/20/20 1742 100 %     Weight 07/20/20 1740 185 lb (83.9 kg)     Height 07/20/20 1740 5\' 5"  (1.651 m)     Head Circumference --      Peak Flow --      Pain Score 07/20/20 1740 9     Pain Loc --      Pain Edu? --      Excl. in GC? --     Constitutional: Alert and oriented. Well appearing and in no distress. Head: Normocephalic and atraumatic. Eyes: Normal extraocular movements Cardiovascular: Normal rate, regular rhythm.  Radial and pedal pulses 2+ bilaterally Respiratory: Normal respiratory effort. No wheezes/rales/rhonchi noted. MSK: Handgrips equal.  No difficulty with gait. Neurologic: Normal speech and language. No gross focal neurologic deficits are appreciated. Skin: 2 cm abrasion to right medial calf.  7 grouped puncture wounds noted of the left posterior thigh.  4 grouped puncture wounds noted to left posterior upper arm superior to the elbow.  1 puncture wound noted to left  posterior lower arm inferior to the elbow. Psychiatric: Mildly anxious appearing.  Patient exhibits appropriate insight and judgment. ____________________________________________  INITIAL IMPRESSION / ASSESSMENT AND PLAN / ED COURSE  Dog Scratch/Bite:  Abrasions and puncture wounds are all very superficial No indication for imaging at this time Wounds cleaned thoroughly with hydrogen peroxide, covered with triple antibiotic ointment, Telfa and tape Tdap today Augmentin 875-125 mg p.o. x1 in ER Rx for Augmentin 875-125 mg p.o. twice daily x10 days Return  precautions discussed ____________________________________________  FINAL CLINICAL IMPRESSION(S) / ED DIAGNOSES  Final diagnoses:  Dog bite of multiple sites      Lorre Munroe, NP 07/20/20 1940    Shaune Pollack, MD 07/21/20 [phone]

## 2020-07-20 NOTE — ED Notes (Signed)
Pt agreeable with plan for discharge as discussed by provider this nurse has verbally reinforced d/c instructions and provided pt with written copy- pt acknowledges verbal understanding and denies any additional questions, concerns, needs - pt ambulatory at discharge independently with steady gait

## 2020-07-20 NOTE — ED Notes (Signed)
Dog bites noted  to posterior L thigh, posterior L FA/elbow R calf and R elbow  -- L FA/elbow region with scant amount of bloody drainage -- after applying antibacterial ointment to each of bite sites the L FA/elbow region covered with nonadherent dressing and wrapped with stretch gauze bandage

## 2020-07-20 NOTE — Discharge Instructions (Addendum)
You were seen today for dog bite of multiple sites.  These areas were cleaned and dressed.  You received a tetanus injection today.  I given you a prescription for antibiotics to take twice daily for the next 10 days.  Please follow-up with your PCP if you notice increased pain, swelling or purulent discharge from these areas.

## 2021-03-08 IMAGING — DX DG CHEST 1V PORT
1 series · 1 of 1 positions shown · non-contrast
Comparison: April 25, 2012.

CLINICAL DATA: Chest pain.

EXAM:
PORTABLE CHEST 1 VIEW

[chest ap]
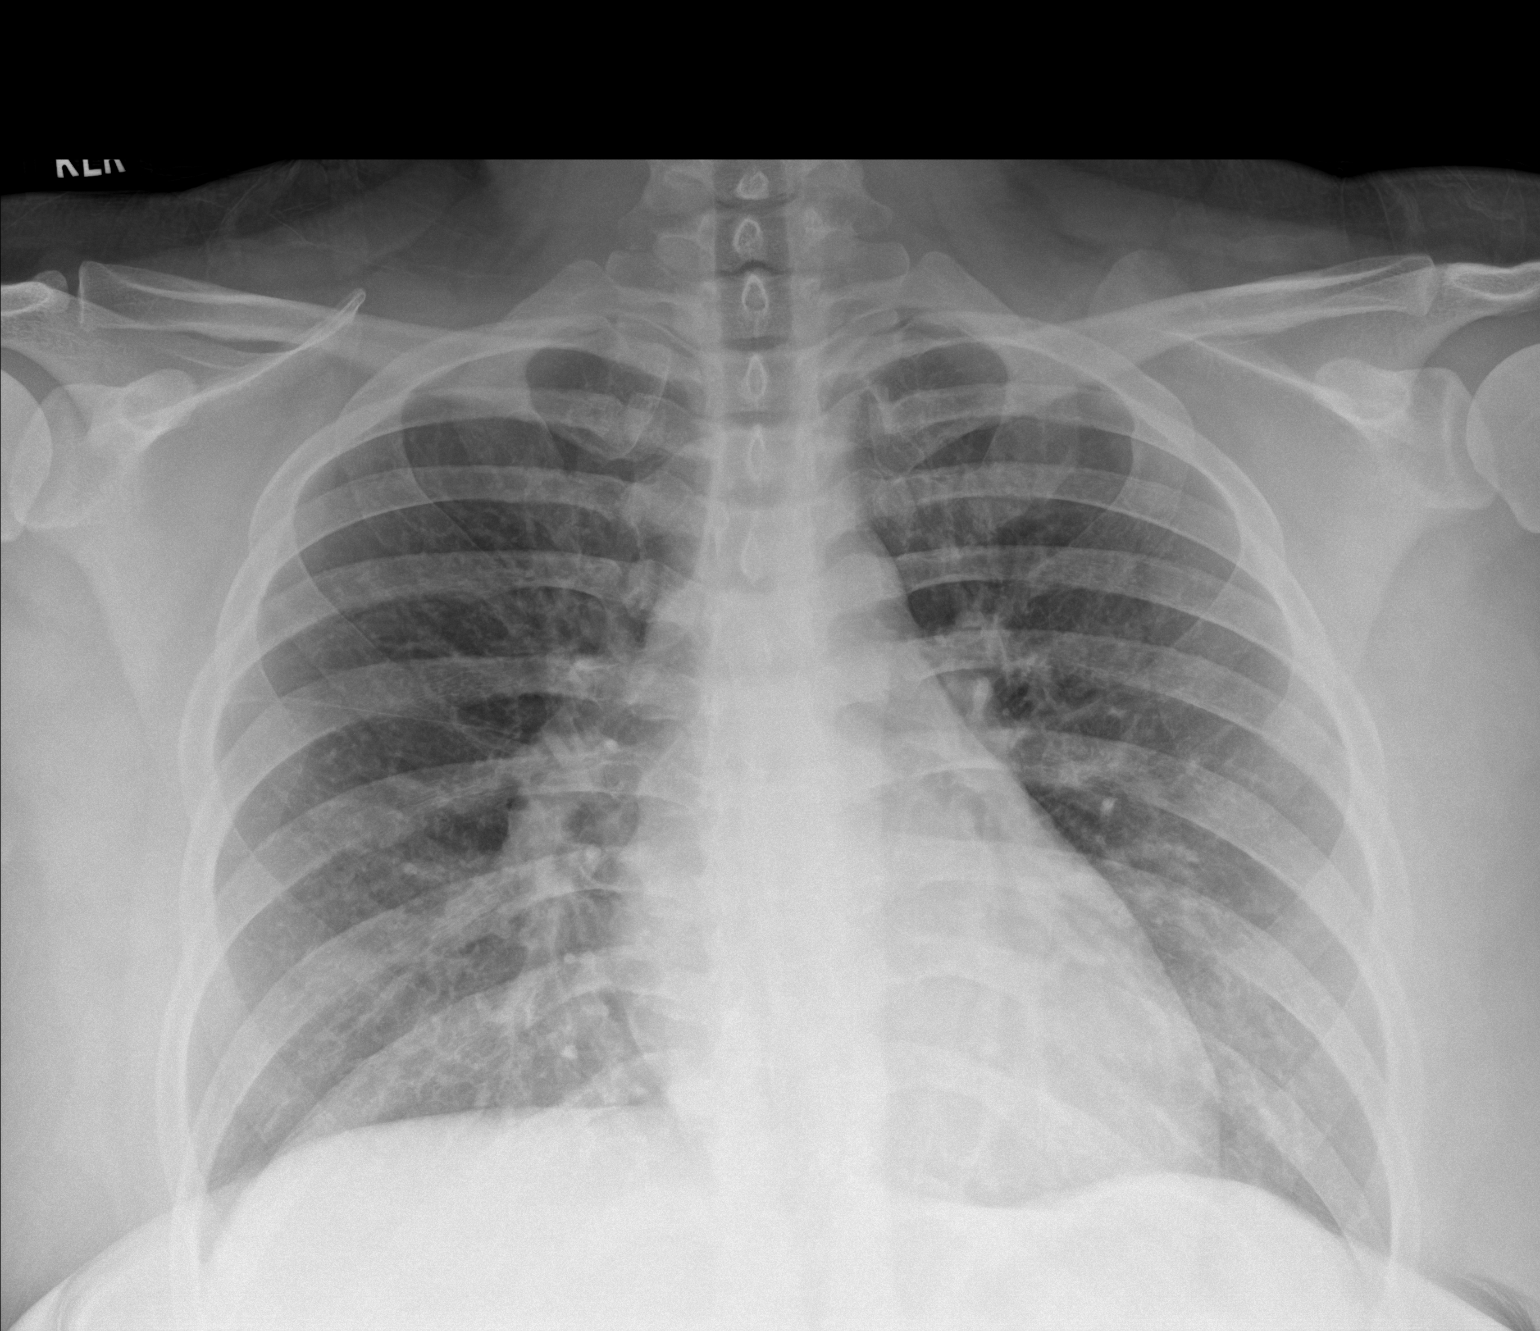

[1 of 1 positions shown; findings below may reference images not displayed]

FINDINGS: The heart size and mediastinal contours are within normal limits.
Both lungs are clear. No pneumothorax or pleural effusion is noted.
The visualized skeletal structures are unremarkable.
IMPRESSION: No active disease.

## 2021-09-04 NOTE — Progress Notes (Signed)
 OMFS Extraction Procedure Note  Assessment: Gwendolyn Barnes is a 31 y.o. female with PMH of hypertension and back pain presenting to [facility] from [facility] for extraction of 19  Plan: - Extraction #19 under local anesthesia  - Discussed procedure, alternatives, and risks (including but not limited to nerve anesthesia/paresthesia, damage to surrounding structures, pain/bleeding/swelling). Informed consent obtained - Procedure note listed below - Discharged home with post-op care instructions - Rx: none - Pt tolerated procedure well and left in stable condition - Follow up prn   Procedure codes: D7140 - extraction erupted tooth/exposed root    Description of procedure:  Type of local anesthesia used: 2 carpule Lidocaine 2% with epinephrine  1:100,000 via IAN block and 1 carpule Septocaine 4% with epinephrine  1:100,000 via infiltration. Aspiration negative.  A periosteal elevator was used to reflect the gingival tissue around the tooth. The tooth was then elevated with straight elevators and extracted with forceps. The socket was curetted and copiously irrigated. Gauze pack placed for hemostasis and good hemostasis achieved.  Complications: None  HPI: Patient claims My lower left tooth broke around 1 year ago and a few months ago the piece fell out   A few weeks ago the tooth began to hurt and become sensitive to hot and cold. Patient has been taking ibuprofen for pain control    The patient does not want to try to save the tooth and prefers for it to be extracted  Patient denies any current or previous history of swelling or drainage associated with the area.  Denies any fevers or chills.  Denies any personal history or family history of bleeding or problems with local anesthesia.  Past Medical History : Past Medical History:  Diagnosis Date  . Back pain   . Hypertension    was not prescribed mediaction because of age    Medications: Current Outpatient  Medications  Medication Sig Dispense Refill  . cyclobenzaprine (FLEXERIL) 10 MG tablet Take 1 tablet (10 mg total) by mouth two (2) times a day as needed for muscle spasms. 20 tablet 0  . oxyCODONE-acetaminophen (PERCOCET) 5-325 mg per tablet Take 1 tablet by mouth every four (4) hours as needed for pain. 20 tablet 0   No current facility-administered medications for this visit.    Past Surgical History: No past surgical history on file.  Family History: family history is not on file.  Allergies:  Patient has no known allergies.  Social History: Tobacco use:   reports that she has been smoking cigarettes. She has a 0.75 pack-year smoking history. She has never used smokeless tobacco. Alcohol use:   reports no history of alcohol use. Drug use:  reports no history of drug use.  Review of Systems : A 10 point review of system has been collected and is negative unless otherwise stated in HPI.  Physical Examination : There were no vitals filed for this visit.  NEURO: CN II-XII grossly intact HEENT: No significant facial edema. No erythema. Face is symmetric. No cervical LAD. MIO >50mm. Able to manage secretions. Intraorally the mucous membranes are pink and well perfused.  Fair dentition. No intraoral swelling or drainage. Occlusion is stable and repeatable.  FOM soft; uvula midline; oropharynx clear.   Imaging: PA radiograph with gross caries to the bone on the D

## 2023-10-02 ENCOUNTER — Emergency Department
Admission: EM | Admit: 2023-10-02 | Discharge: 2023-10-03 | Disposition: A | Discharge status: Home / Self Care | Payer: Self-pay | Attending: Emergency Medicine | Admitting: Emergency Medicine

## 2023-10-02 ENCOUNTER — Other Ambulatory Visit: Payer: Self-pay

## 2023-10-02 DIAGNOSIS — F419 Anxiety disorder, unspecified: Secondary | ICD-10-CM | POA: Insufficient documentation

## 2023-10-02 DIAGNOSIS — T782XXA Anaphylactic shock, unspecified, initial encounter: Secondary | ICD-10-CM | POA: Insufficient documentation

## 2023-10-02 HISTORY — DX: Essential (primary) hypertension: I10

## 2023-10-02 HISTORY — DX: Unspecified asthma, uncomplicated: J45.909

## 2023-10-02 MED ORDER — DEXAMETHASONE SODIUM PHOSPHATE 10 MG/ML IJ SOLN
10.0000 mg | Freq: Once | INTRAMUSCULAR | Status: AC
  Administered 2023-10-02: 10 mg via INTRAVENOUS
  Filled 2023-10-02: qty 1

## 2023-10-02 MED ORDER — LACTATED RINGERS IV BOLUS
1000.0000 mL | Freq: Once | INTRAVENOUS | Status: AC
  Administered 2023-10-02: 1000 mL via INTRAVENOUS

## 2023-10-02 MED ORDER — DIPHENHYDRAMINE HCL 50 MG/ML IJ SOLN
50.0000 mg | Freq: Once | INTRAMUSCULAR | Status: AC
  Administered 2023-10-02: 50 mg via INTRAVENOUS
  Filled 2023-10-02: qty 1

## 2023-10-02 MED ORDER — MIDAZOLAM HCL 2 MG/2ML IJ SOLN
2.0000 mg | Freq: Once | INTRAMUSCULAR | Status: AC
  Administered 2023-10-02: 2 mg via INTRAVENOUS
  Filled 2023-10-02: qty 2

## 2023-10-02 MED ORDER — EPINEPHRINE 0.3 MG/0.3ML IJ SOAJ: 0.3000 mg | INTRAMUSCULAR | 1 refills | Status: AC | PRN

## 2023-10-02 MED ORDER — EPINEPHRINE 0.3 MG/0.3ML IJ SOAJ: 0.3000 mg | Freq: Once | INTRAMUSCULAR | Status: AC

## 2023-10-02 MED ORDER — EPINEPHRINE 0.3 MG/0.3ML IJ SOAJ
INTRAMUSCULAR | Status: AC
  Administered 2023-10-02: 0.3 mg via INTRAMUSCULAR
  Filled 2023-10-02: qty 0.3

## 2023-10-02 NOTE — ED Notes (Signed)
 Pt breathing labored. SOB. Pt stating they can't breathe and their throat feel like it is swelling. Dr. Claudene bedside with verbal orders.

## 2023-10-02 NOTE — ED Provider Notes (Signed)
 [facility] Provider Note    Event Date/Time   First MD Initiated Contact with Patient 10/02/23 2205     (approximate)   History   Allergic Reaction   HPI  Gwendolyn Barnes is a 33 y.o. female who presents to the ED for evaluation of Allergic Reaction   Obese patient presents with rapid onset throat closing sensation, tongue swelling sensation and changes to their voice.  Symptoms started tonight while they were in bed with her spouse watching a movie, eating gummy worms from a gas station that she has had before.  This is never happened before.  She does have a history of panic attacks but this seems different.  No syncope, falls or trauma, recent illnesses or URI   Physical Exam   Triage Vital Signs: ED Triage Vitals  Encounter Vitals Group     BP      Girls Systolic BP Percentile      Girls Diastolic BP Percentile      Boys Systolic BP Percentile      Boys Diastolic BP Percentile      Pulse      Resp      Temp      Temp src      SpO2      Weight      Height      Head Circumference      Peak Flow      Pain Score      Pain Loc      Pain Education      Exclude from Growth Chart     Most recent vital signs: Vitals:   10/02/23 2230 10/02/23 2300  BP: (!) 152/85 126/83  Pulse: (!) 130 (!) 113  Resp: 20 18  Temp:    SpO2: 100% 99%    General: Awake.  Obese, sitting up in bed and looks very uncomfortable.  Hoarse voice.  Fullness to the floor of the mouth beneath the tongue, tongue seems mildly swollen diffusely.  Uvula is at midline without deviation or signs of PTA CV:  Good peripheral perfusion.  Resp:  Normal effort.  Abd:  No distention.  MSK:  No deformity noted.  Neuro:  No focal deficits appreciated. Other:     ED Results / Procedures / Treatments   Labs (all labs ordered are listed, but only abnormal results are displayed) Labs Reviewed - No data to display  EKG   RADIOLOGY   Official radiology report(s): No  results found.  PROCEDURES and INTERVENTIONS:  .Critical Care  Performed by: Claudene Rover, MD Authorized by: Claudene Rover, MD   Critical care provider statement:    Critical care time (minutes):  30   Critical care time was exclusive of:  Separately billable procedures and treating other patients   Critical care was necessary to treat or prevent imminent or life-threatening deterioration of the following conditions:  Toxidrome   Critical care was time spent personally by me on the following activities:  Development of treatment plan with patient or surrogate, discussions with consultants, evaluation of patient's response to treatment, examination of patient, ordering and review of laboratory studies, ordering and review of radiographic studies, ordering and performing treatments and interventions, pulse oximetry, re-evaluation of patient's condition and review of old charts   Medications  EPINEPHrine  (EPI-PEN) injection 0.3 mg (0.3 mg Intramuscular Given 10/02/23 2216)  lactated ringers  bolus 1,000 mL (1,000 mLs Intravenous New Bag/Given 10/02/23 2219)  dexamethasone  (DECADRON ) injection 10 mg (10  mg Intravenous Given 10/02/23 2219)  diphenhydrAMINE  (BENADRYL ) injection 50 mg (50 mg Intravenous Given 10/02/23 2219)  midazolam  (VERSED ) injection 2 mg (2 mg Intravenous Given 10/02/23 2242)     IMPRESSION / MDM / ASSESSMENT AND PLAN / ED COURSE  I reviewed the triage vital signs and the nursing notes.  Differential diagnosis includes, but is not limited to, anaphylaxis, food impaction/bolus, panic attack, peritonsillar abscess  {Patient presents with symptoms of an acute illness or injury that is potentially life-threatening.  Patient presents with throat swelling sensation, altered voice and signs of anaphylaxis requiring an EpiPen .  Quickly improving symptoms after this and subsequent significant anxiety treated with small dose of IV Versed .  Ultimately improving after all of this.  We will  observe her for few hours to ensure no need for repeat dosing of EpiPen  or other complicating features.  Signed out to oncoming physician.  Suspect patient will be suitable for outpatient management after this.  Clinical Course as of 10/02/23 2316  Sat Oct 02, 2023  2221 Reassessed, sitting back in bed and looks a little bit better.  Spouse at the bedside. [DS]  2237 Reassessed, sitting back in bed and voice has improved and normalized.  Reports concern for persistent swelling and she seems quite anxious.  Suspect a significant degree of anxiety contributing [DS]  2316 Reassessed.  Feeling much better after the Versed  I provided reassurance. [DS]    Clinical Course User Index [DS] Claudene Rover, MD     FINAL CLINICAL IMPRESSION(S) / ED DIAGNOSES   Final diagnoses:  Anaphylaxis, initial encounter  Anxiety     Rx / DC Orders   ED Discharge Orders          Ordered    EPINEPHrine  0.3 mg/0.3 mL IJ SOAJ injection  As needed        10/02/23 2316             Note:  This document was prepared using Dragon voice recognition software and may include unintentional dictation errors.   Claudene Rover, MD 10/02/23 [phone]

## 2023-10-03 ENCOUNTER — Emergency Department
Admission: EM | Admit: 2023-10-03 | Discharge: 2023-10-03 | Disposition: A | Discharge status: Home / Self Care | Payer: Self-pay | Attending: Emergency Medicine | Admitting: Emergency Medicine

## 2023-10-03 ENCOUNTER — Emergency Department: Payer: Self-pay

## 2023-10-03 ENCOUNTER — Encounter: Payer: Self-pay | Admitting: Intensive Care

## 2023-10-03 ENCOUNTER — Other Ambulatory Visit: Payer: Self-pay

## 2023-10-03 DIAGNOSIS — M542 Cervicalgia: Secondary | ICD-10-CM | POA: Insufficient documentation

## 2023-10-03 DIAGNOSIS — F419 Anxiety disorder, unspecified: Secondary | ICD-10-CM | POA: Insufficient documentation

## 2023-10-03 LAB — COMPREHENSIVE METABOLIC PANEL WITH GFR
ALT: 21 U/L (ref 0–44)
AST: 23 U/L (ref 15–41)
Albumin: 4.4 g/dL (ref 3.5–5.0)
Alkaline Phosphatase: 65 U/L (ref 38–126)
Anion gap: 12 (ref 5–15)
BUN: 9 mg/dL (ref 6–20)
CO2: 22 mmol/L (ref 22–32)
Calcium: 9.9 mg/dL (ref 8.9–10.3)
Chloride: 106 mmol/L (ref 98–111)
Creatinine, Ser: 0.79 mg/dL (ref 0.44–1.00)
GFR, Estimated: >60 mL/min (ref >60)
Glucose, Bld: 110 mg/dL — ABNORMAL HIGH (ref 70–99)
Potassium: 3.6 mmol/L (ref 3.5–5.1)
Sodium: 140 mmol/L (ref 135–145)
Total Bilirubin: 0.4 mg/dL (ref 0.0–1.2)
Total Protein: 8.7 g/dL — ABNORMAL HIGH (ref 6.5–8.1)

## 2023-10-03 LAB — CBC WITH DIFFERENTIAL/PLATELET
Abs Immature Granulocytes: 0.06 K/uL (ref 0.00–0.07)
Basophils Absolute: 0 K/uL (ref 0.0–0.1)
Basophils Relative: 0 %
Eosinophils Absolute: 0 K/uL (ref 0.0–0.5)
Eosinophils Relative: 0 %
HCT: 40.2 % (ref 36.0–46.0)
Hemoglobin: 13.6 g/dL (ref 12.0–15.0)
Immature Granulocytes: 0 %
Lymphocytes Relative: 14 %
Lymphs Abs: 2.4 K/uL (ref 0.7–4.0)
MCH: 29.1 pg (ref 26.0–34.0)
MCHC: 33.8 g/dL (ref 30.0–36.0)
MCV: 86.1 fL (ref 80.0–100.0)
Monocytes Absolute: 0.8 K/uL (ref 0.1–1.0)
Monocytes Relative: 5 %
Neutro Abs: 13.3 K/uL — ABNORMAL HIGH (ref 1.7–7.7)
Neutrophils Relative %: 81 %
Platelets: 430 K/uL — ABNORMAL HIGH (ref 150–400)
RBC: 4.67 MIL/uL (ref 3.87–5.11)
RDW: 11.9 % (ref 11.5–15.5)
WBC: 16.6 K/uL — ABNORMAL HIGH (ref 4.0–10.5)
nRBC: 0 % (ref 0.0–0.2)

## 2023-10-03 LAB — LIPASE, BLOOD: Lipase: 31 U/L (ref 11–51)

## 2023-10-03 MED ORDER — HYDROXYZINE HCL 25 MG PO TABS: 25.0000 mg | ORAL_TABLET | Freq: Three times a day (TID) | ORAL | 1 refills | Status: AC | PRN

## 2023-10-03 MED ORDER — IOHEXOL 300 MG/ML  SOLN
75.0000 mL | Freq: Once | INTRAMUSCULAR | Status: AC | PRN
  Administered 2023-10-03: 75 mL via INTRAVENOUS

## 2023-10-03 MED ORDER — LORAZEPAM 1 MG PO TABS
1.0000 mg | ORAL_TABLET | Freq: Once | ORAL | Status: AC
  Administered 2023-10-03: 1 mg via ORAL
  Filled 2023-10-03: qty 1

## 2023-10-03 NOTE — ED Triage Notes (Signed)
 Reports being seen here yesterday for allergic reaction and throat swelling and giving epi. Sent home with epi prescription  Reports today after eating two french fries and taking ibuprofen, having the feeling of hard to swallow and reports tongue and uvula feels swollen. Also c/o abdominal pain  NAD noted in triage. Talking in complete sentences

## 2023-10-03 NOTE — ED Provider Notes (Signed)
 [facility] Provider Note    Event Date/Time   First MD Initiated Contact with Patient 10/03/23 1909     (approximate)   History   Abdominal Pain and Allergic Reaction   HPI  Gwendolyn Barnes is a 33 y.o. female who presents to the ED for evaluation of Abdominal Pain and Allergic Reaction   I saw this patient last night and recall him distinctly.  Presented with sensation of throat closing and swollen tongue and concerns for allergic reaction.  Provided EpiPen , steroids and Benadryl  yesterday for possible anaphylaxis, though exam was not the most convincing.  Strong suspicion for superimposed anxiety causing much of the symptoms.  Patient returns today with his wife for evaluation of similar concerns about neck pain, throat pain, feeling like it is difficult to swallow.  Reports able to swallow but it just seems more difficult.  Voice is at baseline and wife reports that he seems okay.   Patient reports chronic epigastric burning and sensation of indigestion that he minimizes and reports is not of concern tonight.   Physical Exam   Triage Vital Signs: ED Triage Vitals  Encounter Vitals Group     BP 10/03/23 1849 (!) 137/110     Girls Systolic BP Percentile --      Girls Diastolic BP Percentile --      Boys Systolic BP Percentile --      Boys Diastolic BP Percentile --      Pulse Rate 10/03/23 1849 (!) 112     Resp 10/03/23 1849 18     Temp 10/03/23 1849 98.4 F (36.9 C)     Temp Source 10/03/23 1849 Oral     SpO2 10/03/23 1849 99 %     Weight 10/03/23 1850 280 lb (127 kg)     Height 10/03/23 1850 5' 5 (1.651 m)     Head Circumference --      Peak Flow --      Pain Score 10/03/23 1850 7     Pain Loc --      Pain Education --      Exclude from Growth Chart --     Most recent vital signs: Vitals:   10/03/23 1930 10/03/23 2030  BP: 128/80 127/71  Pulse: 77 88  Resp: 14 19  Temp:    SpO2: 99% 100%    General: Awake, no distress.   CV:  Good peripheral perfusion.  Resp:  Normal effort.  Abd:  No distention.  MSK:  No deformity noted.  Neuro:  No focal deficits appreciated. Other:  Normal-appearing lips, mouth, posterior oropharynx and tongue.  No significant tongue swelling, I do not require a tongue depressor to visualize posterior oropharynx, uvula, tonsillar pillars.  No signs of PTA, swelling or uvular deviation.  No swelling to the floor of the mouth.  Full range of motion to the neck without meningeal features, rash or external swelling.  No palpable lymph nodes   ED Results / Procedures / Treatments   Labs (all labs ordered are listed, but only abnormal results are displayed) Labs Reviewed  COMPREHENSIVE METABOLIC PANEL WITH GFR - Abnormal; Notable for the following components:      Result Value   Glucose, Bld 110 (*)    Total Protein 8.7 (*)    All other components within normal limits  CBC WITH DIFFERENTIAL/PLATELET - Abnormal; Notable for the following components:   WBC 16.6 (*)    Platelets 430 (*)    Neutro Abs  13.3 (*)    All other components within normal limits  LIPASE, BLOOD  URINALYSIS, ROUTINE W REFLEX MICROSCOPIC  POC URINE PREG, ED    EKG   RADIOLOGY CT soft tissue neck interpreted by me without evidence of acute pathology  Official radiology report(s): CT Soft Tissue Neck W Contrast Result Date: 10/03/2023 EXAM: CT NECK WITH CONTRAST 10/03/2023 10:55:22 PM TECHNIQUE: CT of the neck was performed with the administration of intravenous contrast. Multiplanar reformatted images are provided for review. Automated exposure control, iterative reconstruction, and/or weight based adjustment of the mA/kV was utilized to reduce the radiation dose to as low as reasonably achievable. CONTRAST: 75mL iohexol  (OMNIPAQUE ) 300 MG/ML solution COMPARISON: None available. CLINICAL HISTORY: Odynophagia, sensation of dysphagia, throat pain. Reports being seen here yesterday for allergic reaction and throat  swelling and giving epi. Sent home with epi prescription. Reports today after eating two french fries and taking ibuprofen, having the feeling of hard to swallow and reports tongue and uvula feels swollen. Also c/o abdominal pain. NAD noted in triage. Talking in complete sentences. FINDINGS: AERODIGESTIVE TRACT: No discrete mass. No edema. SALIVARY GLANDS: The parotid and submandibular glands are unremarkable. THYROID: Unremarkable. LYMPH NODES: Multiple subcentimeter bilateral cervical lymph nodes. SOFT TISSUES: No mass or fluid collection. BRAIN, ORBITS, SINUSES AND MASTOIDS: No acute abnormality. LUNGS AND MEDIASTINUM: No acute abnormality. BONES: No focal bone abnormality. IMPRESSION: 1. No acute findings or discrete mass in the neck. Electronically signed by: Franky Stanford MD 10/03/2023 11:09 PM EDT RP Workstation: [id number]    PROCEDURES and INTERVENTIONS:  Procedures  Medications  LORazepam  (ATIVAN ) tablet 1 mg (1 mg Oral Given 10/03/23 2113)  iohexol  (OMNIPAQUE ) 300 MG/ML solution 75 mL (75 mLs Intravenous Contrast Given 10/03/23 2252)     IMPRESSION / MDM / ASSESSMENT AND PLAN / ED COURSE  I reviewed the triage vital signs and the nursing notes.  Differential diagnosis includes, but is not limited to, anaphylaxis, food impaction or retained foreign body, anxiety or acute stress reaction, viral syndrome, PTA  {Patient presents with symptoms of an acute illness or injury that is potentially life-threatening.  Patient presents with recurrence of concerns for difficulty swallowing and tongue swelling without evidence of acute pathology on exam and I suspect largely caused by anxiety, with a benign workup and ultimately suitable for outpatient management.  Normal exam to me with an anxious patient.  Presenting tachycardia self resolved subsequently normal vital signs.  Leukocytosis is noted and I suspect reactive from steroids received yesterday.  Normal metabolic panel and  lipase.  Ultimately obtain a CT scan and this is without acute pathology.  I do not believe this patient requires additional workup or admission to the hospital with what I am seeing.  Discussed close ED return precautions.  Discharged with as needed anxiolytics  Clinical Course as of 10/03/23 2332  [place] Oct 03, 2023  2216 Reassessed.  Reports the same and no better with Ativan .  Discussed plan of care and we will just get a CT scan [DS]  2328 Reassessed.  Still looks well to me we discussed CT results and possible etiologies of symptoms.  Discussed expectant management and return precautions [DS]    Clinical Course User Index [DS] Claudene Rover, MD     FINAL CLINICAL IMPRESSION(S) / ED DIAGNOSES   Final diagnoses:  Neck pain  Anxiety     Rx / DC Orders   ED Discharge Orders          Ordered  hydrOXYzine  (ATARAX ) 25 MG tablet  3 times daily PRN        10/03/23 2323             Note:  This document was prepared using Dragon voice recognition software and may include unintentional dictation errors.   Claudene Rover, MD 10/03/23 [phone]

## 2023-10-03 NOTE — ED Provider Notes (Signed)
  Physical Exam  BP 126/83   Pulse (!) 113   Temp 97.9 F (36.6 C)   Resp 18   Ht 5' 5 (1.651 m)   Wt 127 kg   SpO2 99%   BMI 46.59 kg/m   Physical Exam  Procedures  Procedures  ED Course / MDM   Clinical Course as of 10/03/23 0013  Sat Oct 02, 2023  2221 Reassessed, sitting back in bed and looks a little bit better.  Spouse at the bedside. [DS]  2237 Reassessed, sitting back in bed and voice has improved and normalized.  Reports concern for persistent swelling and she seems quite anxious.  Suspect a significant degree of anxiety contributing [DS]  2316 Reassessed.  Feeling much better after the Versed  I provided reassurance. [DS]    Clinical Course User Index [DS] Claudene Rover, MD   Medical Decision Making Risk Prescription drug management.   Received patient in signout.  33 year old female presenting today for concerns of possible allergic reaction with feeling sensation of throat closing and changes to the voice.  Happen while eating gummy worms.  No prior history of the same.  Seen with initial provider and given EpiPen , Benadryl , Decadron , Versed , and 1 L LR.  Signed out pending observation and likely discharge if no recurrence of symptoms at 3 hours.  Patient reassessed with continued improvement in symptoms.  Lung auscultation unremarkable and vital signs stable.  Will discharge at this time with strict return precautions as well as EpiPen  to the pharmacy.   Malvina Alm DASEN, MD 10/03/23 0100

## 2023-10-05 ENCOUNTER — Encounter (HOSPITAL_COMMUNITY): Payer: Self-pay

## 2023-10-05 ENCOUNTER — Emergency Department (HOSPITAL_COMMUNITY): Payer: Self-pay

## 2023-10-05 ENCOUNTER — Other Ambulatory Visit: Payer: Self-pay

## 2023-10-05 ENCOUNTER — Emergency Department (HOSPITAL_COMMUNITY)
Admission: EM | Admit: 2023-10-05 | Discharge: 2023-10-05 | Disposition: A | Discharge status: Home / Self Care | Payer: Self-pay

## 2023-10-05 DIAGNOSIS — R000 Tachycardia, unspecified: Secondary | ICD-10-CM | POA: Insufficient documentation

## 2023-10-05 DIAGNOSIS — J45909 Unspecified asthma, uncomplicated: Secondary | ICD-10-CM | POA: Insufficient documentation

## 2023-10-05 DIAGNOSIS — I10 Essential (primary) hypertension: Secondary | ICD-10-CM | POA: Insufficient documentation

## 2023-10-05 DIAGNOSIS — R042 Hemoptysis: Secondary | ICD-10-CM | POA: Insufficient documentation

## 2023-10-05 LAB — COMPREHENSIVE METABOLIC PANEL WITH GFR
ALT: 26 U/L (ref 0–44)
AST: 25 U/L (ref 15–41)
Albumin: 4.1 g/dL (ref 3.5–5.0)
Alkaline Phosphatase: 60 U/L (ref 38–126)
Anion gap: 9 (ref 5–15)
BUN: 5 mg/dL — ABNORMAL LOW (ref 6–20)
CO2: 23 mmol/L (ref 22–32)
Calcium: 9.2 mg/dL (ref 8.9–10.3)
Chloride: 103 mmol/L (ref 98–111)
Creatinine, Ser: 0.86 mg/dL (ref 0.44–1.00)
GFR, Estimated: >60 mL/min (ref >60)
Glucose, Bld: 116 mg/dL — ABNORMAL HIGH (ref 70–99)
Potassium: 3.6 mmol/L (ref 3.5–5.1)
Sodium: 135 mmol/L (ref 135–145)
Total Bilirubin: 0.9 mg/dL (ref 0.0–1.2)
Total Protein: 7.9 g/dL (ref 6.5–8.1)

## 2023-10-05 LAB — TYPE AND SCREEN
ABO/RH(D): B POS
Antibody Screen: NEGATIVE

## 2023-10-05 LAB — CBC
HCT: 41.5 % (ref 36.0–46.0)
Hemoglobin: 13.8 g/dL (ref 12.0–15.0)
MCH: 29.2 pg (ref 26.0–34.0)
MCHC: 33.3 g/dL (ref 30.0–36.0)
MCV: 87.9 fL (ref 80.0–100.0)
Platelets: 390 K/uL (ref 150–400)
RBC: 4.72 MIL/uL (ref 3.87–5.11)
RDW: 11.9 % (ref 11.5–15.5)
WBC: 8.6 K/uL (ref 4.0–10.5)
nRBC: 0 % (ref 0.0–0.2)

## 2023-10-05 LAB — HCG, SERUM, QUALITATIVE: Preg, Serum: NEGATIVE

## 2023-10-05 MED ORDER — IOHEXOL 350 MG/ML SOLN
135.0000 mL | Freq: Once | INTRAVENOUS | Status: AC | PRN
Start: 2023-10-05 — End: 2023-10-05
  Administered 2023-10-05: 135 mL via INTRAVENOUS

## 2023-10-05 MED ORDER — HYDROCODONE BIT-HOMATROP MBR 5-1.5 MG/5ML PO SOLN: 5.0000 mL | Freq: Four times a day (QID) | ORAL | 0 refills | Status: AC | PRN

## 2023-10-05 NOTE — ED Triage Notes (Signed)
 Pt from home with reports of hemoptysis, chest pain, and SHOB x 3 days. Pt reports she was seen on the 19th and was given epi for allergic reaction and since that time she has had these symptoms.

## 2023-10-05 NOTE — ED Triage Notes (Signed)
 Pt states she has been having nausea, vomiting, and diarrhea with blood in vomit and dark stool.

## 2023-10-05 NOTE — Discharge Instructions (Addendum)
 CT study did not show any concerning findings.  No blood clots or other concerns.  You likely have bronchitis.  I have sent a cough syrup medicine into the pharmacy.  This will make you drowsy so do not drive or do anything else dangerous after taking this medicine.  Establish a primary care provider.

## 2023-10-05 NOTE — ED Provider Notes (Signed)
 [place] [facility] AT [facility] Provider Note   CSN: [id number] Arrival date & time: 10/05/23  1221     Patient presents with: Hemoptysis   Gwendolyn Barnes is a 33 y.o. female.   33 year old female presents today for concern of hemoptysis.  This started this morning.  She states over the past week she has been sick.  She recently had a travel down to [facility] but states she remained sick during most of the trip.  She returned and was taking a cough and cold medicine when she believes she had an anaphylactic reaction and was evaluated at the emergency department for this and received EpiPen .  This morning she states with cough she is having blood-tinged sputum.  Endorses some emesis but she does have pictures which she shows me and it appears that the blood is from her cough.  No blood in her episode of emesis.  She had 3 episodes of hemoptysis today.  No blood in stool.  The history is provided by the patient. No language interpreter was used.       Prior to Admission medications   Medication Sig Start Date End Date Taking? Authorizing Provider  albuterol  (VENTOLIN  HFA) 108 (90 Base) MCG/ACT inhaler Inhale 2 puffs into the lungs every 6 (six) hours as needed for wheezing or shortness of breath. 07/21/19   Willo Dunnings, MD  EPINEPHrine  0.3 mg/0.3 mL IJ SOAJ injection Inject 0.3 mg into the muscle as needed. 10/02/23   Claudene Rover, MD  hydrOXYzine  (ATARAX ) 25 MG tablet Take 1 tablet (25 mg total) by mouth 3 (three) times daily as needed for anxiety. 10/03/23   Claudene Rover, MD    Allergies: Patient has no known allergies.    Review of Systems  Constitutional:  Negative for chills and fever.  Respiratory:  Positive for cough and shortness of breath.   Cardiovascular:  Negative for chest pain.  Gastrointestinal:  Negative for abdominal pain.  Neurological:  Negative for light-headedness.  All other systems reviewed and are negative.   Updated Vital  Signs BP (!) 156/102 (BP Location: Right Arm)   Pulse 100   Temp 98.1 F (36.7 C)   Resp 18   Ht 5' 5 (1.651 m)   Wt 127 kg   LMP 09/21/2023 (Approximate)   SpO2 95%   BMI 46.59 kg/m   Physical Exam Vitals and nursing note reviewed.  Constitutional:      General: She is not in acute distress.    Appearance: Normal appearance. She is not ill-appearing.  HENT:     Head: Normocephalic and atraumatic.     Nose: Nose normal.  Eyes:     Conjunctiva/sclera: Conjunctivae normal.  Cardiovascular:     Rate and Rhythm: Regular rhythm. Tachycardia present.  Pulmonary:     Effort: Pulmonary effort is normal. No respiratory distress.  Musculoskeletal:        General: No deformity. Normal range of motion.     Cervical back: Normal range of motion.  Skin:    Findings: No rash.  Neurological:     Mental Status: She is alert.     (all labs ordered are listed, but only abnormal results are displayed) Labs Reviewed  COMPREHENSIVE METABOLIC PANEL WITH GFR - Abnormal; Notable for the following components:      Result Value   Glucose, Bld 116 (*)    BUN 5 (*)    All other components within normal limits  CBC  HCG, SERUM, QUALITATIVE  TYPE AND SCREEN    EKG: EKG Interpretation Date/Time:  Tuesday October 05 2023 12:37:33 EDT Ventricular Rate:  84 PR Interval:  142 QRS Duration:  74 QT Interval:  358 QTC Calculation: 423 R Axis:   18  Text Interpretation: Normal sinus rhythm with sinus arrhythmia Minimal voltage criteria for LVH, may be normal variant ( R in aVL ) Borderline ECG When compared with ECG of 21-Jul-2019 10:25, PREVIOUS ECG IS PRESENT Confirmed by Neysa Clap [phone]) on 10/05/2023 2:10:04 PM  Radiology: CT Soft Tissue Neck W Contrast Result Date: 10/03/2023 EXAM: CT NECK WITH CONTRAST 10/03/2023 10:55:22 PM TECHNIQUE: CT of the neck was performed with the administration of intravenous contrast. Multiplanar reformatted images are provided for review. Automated exposure  control, iterative reconstruction, and/or weight based adjustment of the mA/kV was utilized to reduce the radiation dose to as low as reasonably achievable. CONTRAST: 75mL iohexol  (OMNIPAQUE ) 300 MG/ML solution COMPARISON: None available. CLINICAL HISTORY: Odynophagia, sensation of dysphagia, throat pain. Reports being seen here yesterday for allergic reaction and throat swelling and giving epi. Sent home with epi prescription. Reports today after eating two french fries and taking ibuprofen, having the feeling of hard to swallow and reports tongue and uvula feels swollen. Also c/o abdominal pain. NAD noted in triage. Talking in complete sentences. FINDINGS: AERODIGESTIVE TRACT: No discrete mass. No edema. SALIVARY GLANDS: The parotid and submandibular glands are unremarkable. THYROID: Unremarkable. LYMPH NODES: Multiple subcentimeter bilateral cervical lymph nodes. SOFT TISSUES: No mass or fluid collection. BRAIN, ORBITS, SINUSES AND MASTOIDS: No acute abnormality. LUNGS AND MEDIASTINUM: No acute abnormality. BONES: No focal bone abnormality. IMPRESSION: 1. No acute findings or discrete mass in the neck. Electronically signed by: Franky Stanford MD 10/03/2023 11:09 PM EDT RP Workstation: [id number]     Procedures   Medications Ordered in the ED - No data to display                                  Medical Decision Making Amount and/or Complexity of Data Reviewed Labs: ordered. Radiology: ordered.  Risk Prescription drug management.   Medical Decision Making / ED Course   This patient presents to the ED for concern of hemoptysis, this involves an extensive number of treatment options, and is a complaint that carries with it a high risk of complications and morbidity.  The differential diagnosis includes bronchitis, PE, pneumonia  MDM: 33 year old female presents today for concern of hemoptysis.  Sick for the past week but hemoptysis started today. Extensive discussion had to delineate between  hemoptysis and hematemesis.  She did show me pictures which were all indicative of blood-tinged sputum and not emesis. Had a recent long road trip to [facility]. Blood work reassuring.  Will obtain PE study.  PE study negative. Likely bronchitis. Supportive care discussed. Hycodan sent. Patient in agreement with plan.  Discharged in stable condition.   Additional history obtained: -Additional history obtained from dad who is at bedside -External records from outside source obtained and reviewed including: Chart review including previous notes, labs, imaging, consultation notes   Lab Tests: -I ordered, reviewed, and interpreted labs.   The pertinent results include:   Labs Reviewed  COMPREHENSIVE METABOLIC PANEL WITH GFR - Abnormal; Notable for the following components:      Result Value   Glucose, Bld 116 (*)    BUN 5 (*)    All other components within normal limits  CBC  HCG, SERUM, QUALITATIVE  TYPE AND SCREEN      EKG  EKG Interpretation Date/Time:  Tuesday October 05 2023 12:37:33 EDT Ventricular Rate:  84 PR Interval:  142 QRS Duration:  74 QT Interval:  358 QTC Calculation: 423 R Axis:   18  Text Interpretation: Normal sinus rhythm with sinus arrhythmia Minimal voltage criteria for LVH, may be normal variant ( R in aVL ) Borderline ECG When compared with ECG of 21-Jul-2019 10:25, PREVIOUS ECG IS PRESENT Confirmed by Neysa Clap [phone]) on 10/05/2023 2:10:04 PM         Imaging Studies ordered: I ordered imaging studies including CT chest PE study I independently visualized and interpreted imaging. I agree with the radiologist interpretation   Medicines ordered and prescription drug management: No orders of the defined types were placed in this encounter.   -I have reviewed the patients home medicines and have made adjustments as needed    Co morbidities that complicate the patient evaluation  Past Medical History:  Diagnosis Date   Asthma     Hypertension       Dispostion: Discharged in stable condition.  Return precaution discussed.  Patient voices understanding and is in agreement with plan.   Final diagnoses:  Hemoptysis    ED Discharge Orders          Ordered    HYDROcodone  bit-homatropine (HYCODAN) 5-1.5 MG/5ML syrup  Every 6 hours PRN        10/05/23 1517               Hildegard Loge, PA-C 10/05/23 1517    Neysa Clap PARAS, DO 10/05/23 1531

## 2023-11-25 ENCOUNTER — Other Ambulatory Visit: Payer: Self-pay

## 2023-11-25 ENCOUNTER — Encounter (HOSPITAL_COMMUNITY): Payer: Self-pay | Admitting: *Deleted

## 2023-11-25 ENCOUNTER — Emergency Department (HOSPITAL_COMMUNITY)
Admission: EM | Admit: 2023-11-25 | Discharge: 2023-11-26 | Disposition: A | Discharge status: Home / Self Care | Payer: Self-pay | Attending: Emergency Medicine | Admitting: Emergency Medicine

## 2023-11-25 DIAGNOSIS — R1310 Dysphagia, unspecified: Secondary | ICD-10-CM | POA: Insufficient documentation

## 2023-11-25 NOTE — ED Triage Notes (Signed)
 PT states she was seen in [place] 3 days, given steroids but they are wearing off now. Reports SOB associated with the swelling.

## 2023-11-25 NOTE — ED Triage Notes (Signed)
 Ongoing sore throat x 3 months  Seen at [facility] and given injection of decadron  3 days ago Referral to GI for endoscopy; awaiting appointment. Having increased pain tonight; able to tolerate liquids intermittently

## 2023-11-26 MED ORDER — DEXAMETHASONE SODIUM PHOSPHATE 10 MG/ML IJ SOLN
10.0000 mg | Freq: Once | INTRAMUSCULAR | Status: AC
Start: 2023-11-26 — End: 2023-11-26
  Administered 2023-11-26: 10 mg via INTRAMUSCULAR
  Filled 2023-11-26: qty 1

## 2023-11-26 MED ORDER — KETOROLAC TROMETHAMINE 15 MG/ML IJ SOLN
15.0000 mg | Freq: Once | INTRAMUSCULAR | Status: AC
  Administered 2023-11-26: 15 mg via INTRAMUSCULAR
  Filled 2023-11-26: qty 1

## 2023-11-26 MED ORDER — PREDNISONE 10 MG PO TABS: ORAL_TABLET | ORAL | 0 refills | Status: AC

## 2023-11-26 NOTE — ED Provider Notes (Signed)
 [place] EMERGENCY DEPARTMENT AT [facility] Provider Note   CSN: [id number] Arrival date & time: 11/25/23  2342     Patient presents with: Oral Swelling   Gwendolyn Barnes is a 33 y.o. adult.   HPI   Patient has been having trouble with intermittent difficulty swallowing.  Patient feels like a flap in the back of her throat that is making it difficult for her to swallow.  Patient states her tongue starts to get numb.  She is also having some trouble with sore throat.  She has not had any fevers or chills.  Patient has been evaluated several times for this in the past.  Patient has had CT scans in our system going back to July of this year.  She had CT scan of her chest and neck.  No acute abnormality was noted.  The patient was also recently seen at another medical facility 2 days ago.  Patient had a CT scan of the neck that was normal..  Last month she did have a TSH that was normal.  Prior to Admission medications   Medication Sig Start Date End Date Taking? Authorizing Provider  predniSONE  (DELTASONE ) 10 MG tablet Take 5 tablets (50 mg total) by mouth daily with breakfast for 2 days, THEN 4 tablets (40 mg total) daily with breakfast for 2 days, THEN 3 tablets (30 mg total) daily with breakfast for 2 days, THEN 2 tablets (20 mg total) daily with breakfast for 2 days, THEN 1 tablet (10 mg total) daily with breakfast for 2 days. 11/26/23 12/06/23 Yes Randol Simmonds, MD  albuterol  (VENTOLIN  HFA) [phone] Base) MCG/ACT inhaler Inhale 2 puffs into the lungs every 6 (six) hours as needed for wheezing or shortness of breath. 07/21/19   Willo Dunnings, MD  EPINEPHrine  0.3 mg/0.3 mL IJ SOAJ injection Inject 0.3 mg into the muscle as needed. 10/02/23   Claudene Rover, MD  HYDROcodone  bit-homatropine [facility]) 5-1.5 MG/5ML syrup Take 5 mLs by mouth every 6 (six) hours as needed for cough. 10/05/23   Hildegard Loge, PA-C  hydrOXYzine  (ATARAX ) 25 MG tablet Take 1 tablet (25 mg total) by mouth 3 (three) times  daily as needed for anxiety. 10/03/23   Claudene Rover, MD    Allergies: Patient has no known allergies.    Review of Systems  Updated Vital Signs BP (!) 147/92   Pulse 80   Temp 98.2 F (36.8 C)   Resp 18   LMP 11/16/2023   SpO2 99%   Physical Exam Vitals and nursing note reviewed.  Constitutional:      General: He is not in acute distress.    Appearance: He is well-developed.  HENT:     Head: Normocephalic and atraumatic.     Right Ear: External ear normal.     Left Ear: External ear normal.  Eyes:     General: No scleral icterus.       Right eye: No discharge.        Left eye: No discharge.     Conjunctiva/sclera: Conjunctivae normal.  Neck:     Trachea: No tracheal deviation.     Comments: No erythema, no induration, tenderness to palpation thyroid cartilage Cardiovascular:     Rate and Rhythm: Normal rate.  Pulmonary:     Effort: Pulmonary effort is normal. No respiratory distress.     Breath sounds: No stridor.  Abdominal:     General: There is no distension.  Musculoskeletal:  General: No swelling or deformity.     Cervical back: Neck supple.  Skin:    General: Skin is warm and dry.     Findings: No rash.  Neurological:     Mental Status: He is alert. Mental status is at baseline.     Cranial Nerves: No dysarthria or facial asymmetry.     Motor: No seizure activity.     (all labs ordered are listed, but only abnormal results are displayed) Labs Reviewed - No data to display  EKG: None  Radiology: No results found.   Procedures   Medications Ordered in the ED  dexamethasone  (DECADRON ) injection 10 mg (10 mg Intramuscular Given by Other 11/26/23 0038)  ketorolac  (TORADOL ) 15 MG/ML injection 15 mg (15 mg Intramuscular Given by Other 11/26/23 0039)                                    Medical Decision Making  Patient has been having issues with recurrent difficulty swallowing.  Patient has been evaluated multiple times for this over the  last couple months.  No clear etiology noted.  Patient did have a CT scan of her neck just 2 days ago.  It did not show any evidence of thyroid swelling.  There is no mass or obstruction..  Patient states she has been referred for for some type of procedure which sounds possibly like an endoscopy at [facility].  At this time the patient is breathing easily.  She is not having any distress.  Do not feel that any further emergent evaluation is necessary.  Patient states that steroids have been helping.  I will give her a prescription for oral steroids and have her follow-up with ENT as planned     Final diagnoses:  Dysphagia, unspecified type    ED Discharge Orders          Ordered    predniSONE  (DELTASONE ) 10 MG tablet  Q breakfast        11/26/23 0713               Randol Simmonds, MD 11/26/23 [phone]

## 2023-11-26 NOTE — ED Notes (Signed)
 Pt states he feels less anxious but does endorse RLQ abdominal pain & still feeling like his throat is swollen.

## 2023-11-26 NOTE — ED Notes (Signed)
 Pt states she is having more trouble swallowing, VSS. Pt is tearful at this time. States it is scaring her when she swallows. EDP made aware.

## 2023-11-26 NOTE — Discharge Instructions (Addendum)
 Continue the antacid medications.  I have written for prescription of oral steroids.  Follow-up with the ENT or GI doctor as planned.

## 2023-11-26 NOTE — ED Notes (Addendum)
 Pt states her tongue is feeling numb & she is having increased difficulty swallowing. Pt speaking clearly but is tearful. VSS reassessed. EDP made aware, no new orders at this time.

## 2023-12-25 ENCOUNTER — Emergency Department
Admission: EM | Admit: 2023-12-25 | Discharge: 2023-12-25 | Disposition: A | Discharge status: Home / Self Care | Payer: Self-pay | Attending: Emergency Medicine | Admitting: Emergency Medicine

## 2023-12-25 ENCOUNTER — Other Ambulatory Visit: Payer: Self-pay

## 2023-12-25 ENCOUNTER — Encounter: Payer: Self-pay | Admitting: Emergency Medicine

## 2023-12-25 DIAGNOSIS — I10 Essential (primary) hypertension: Secondary | ICD-10-CM | POA: Insufficient documentation

## 2023-12-25 DIAGNOSIS — R7401 Elevation of levels of liver transaminase levels: Secondary | ICD-10-CM | POA: Insufficient documentation

## 2023-12-25 DIAGNOSIS — J45909 Unspecified asthma, uncomplicated: Secondary | ICD-10-CM | POA: Insufficient documentation

## 2023-12-25 DIAGNOSIS — J028 Acute pharyngitis due to other specified organisms: Secondary | ICD-10-CM

## 2023-12-25 DIAGNOSIS — M5431 Sciatica, right side: Secondary | ICD-10-CM | POA: Insufficient documentation

## 2023-12-25 DIAGNOSIS — J029 Acute pharyngitis, unspecified: Secondary | ICD-10-CM | POA: Insufficient documentation

## 2023-12-25 DIAGNOSIS — Z7951 Long term (current) use of inhaled steroids: Secondary | ICD-10-CM | POA: Insufficient documentation

## 2023-12-25 LAB — CBC
HCT: 38.6 % (ref 36.0–46.0)
Hemoglobin: 12.7 g/dL (ref 12.0–15.0)
MCH: 29.3 pg (ref 26.0–34.0)
MCHC: 32.9 g/dL (ref 30.0–36.0)
MCV: 89.1 fL (ref 80.0–100.0)
Platelets: 380 K/uL (ref 150–400)
RBC: 4.33 MIL/uL (ref 3.87–5.11)
RDW: 11.9 % (ref 11.5–15.5)
WBC: 8.8 K/uL (ref 4.0–10.5)
nRBC: 0 % (ref 0.0–0.2)

## 2023-12-25 LAB — BASIC METABOLIC PANEL WITH GFR
Anion gap: 11 (ref 5–15)
BUN: 9 mg/dL (ref 6–20)
CO2: 24 mmol/L (ref 22–32)
Calcium: 9.1 mg/dL (ref 8.9–10.3)
Chloride: 101 mmol/L (ref 98–111)
Creatinine, Ser: 0.83 mg/dL (ref 0.44–1.00)
GFR, Estimated: >60 mL/min (ref >60)
Glucose, Bld: 107 mg/dL — ABNORMAL HIGH (ref 70–99)
Potassium: 4 mmol/L (ref 3.5–5.1)
Sodium: 136 mmol/L (ref 135–145)

## 2023-12-25 LAB — URINALYSIS, ROUTINE W REFLEX MICROSCOPIC
Bacteria, UA: NONE SEEN
Bilirubin Urine: NEGATIVE
Glucose, UA: NEGATIVE mg/dL
Hgb urine dipstick: NEGATIVE
Ketones, ur: NEGATIVE mg/dL
Leukocytes,Ua: NEGATIVE
Nitrite: NEGATIVE
Protein, ur: 30 mg/dL — AB
Specific Gravity, Urine: 1.03 (ref 1.005–1.030)
pH: 6 (ref 5.0–8.0)

## 2023-12-25 LAB — HEPATIC FUNCTION PANEL
ALT: 50 U/L — ABNORMAL HIGH (ref 0–44)
AST: 43 U/L — ABNORMAL HIGH (ref 15–41)
Albumin: 4.2 g/dL (ref 3.5–5.0)
Alkaline Phosphatase: 65 U/L (ref 38–126)
Bilirubin, Direct: <0.1 mg/dL (ref 0.0–0.2)
Total Bilirubin: 0.4 mg/dL (ref 0.0–1.2)
Total Protein: 7.8 g/dL (ref 6.5–8.1)

## 2023-12-25 LAB — RESP PANEL BY RT-PCR (RSV, FLU A&B, COVID)  RVPGX2
Influenza A by PCR: NEGATIVE
Influenza B by PCR: NEGATIVE
Resp Syncytial Virus by PCR: NEGATIVE
SARS Coronavirus 2 by RT PCR: NEGATIVE

## 2023-12-25 LAB — GROUP A STREP BY PCR: Group A Strep by PCR: NOT DETECTED

## 2023-12-25 LAB — LIPASE, BLOOD: Lipase: 26 U/L (ref 11–51)

## 2023-12-25 LAB — POC URINE PREG, ED: Preg Test, Ur: NEGATIVE

## 2023-12-25 MED ORDER — LIDOCAINE 5 % EX PTCH
1.0000 | MEDICATED_PATCH | CUTANEOUS | 0 refills | Status: AC
Start: 2023-12-25 — End: 2024-01-04

## 2023-12-25 MED ORDER — ONDANSETRON 4 MG PO TBDP
4.0000 mg | ORAL_TABLET | Freq: Once | ORAL | Status: AC
  Administered 2023-12-25: 4 mg via ORAL
  Filled 2023-12-25: qty 1

## 2023-12-25 MED ORDER — IBUPROFEN 800 MG PO TABS
800.0000 mg | ORAL_TABLET | Freq: Once | ORAL | Status: AC
  Administered 2023-12-25: 800 mg via ORAL
  Filled 2023-12-25: qty 1

## 2023-12-25 MED ORDER — HYDROCODONE-ACETAMINOPHEN 5-325 MG PO TABS
2.0000 | ORAL_TABLET | Freq: Once | ORAL | Status: AC
  Administered 2023-12-25: 2 via ORAL
  Filled 2023-12-25: qty 2

## 2023-12-25 MED ORDER — IBUPROFEN 800 MG PO TABS: 800.0000 mg | ORAL_TABLET | Freq: Three times a day (TID) | ORAL | 0 refills | Status: AC | PRN

## 2023-12-25 MED ORDER — ONDANSETRON 4 MG PO TBDP: 4.0000 mg | ORAL_TABLET | Freq: Four times a day (QID) | ORAL | 0 refills | Status: AC | PRN

## 2023-12-25 MED ORDER — HYDROCODONE-ACETAMINOPHEN 5-325 MG PO TABS: 2.0000 | ORAL_TABLET | Freq: Three times a day (TID) | ORAL | 0 refills | Status: AC | PRN

## 2023-12-25 NOTE — ED Triage Notes (Signed)
 Pt reports sore throat x 4 days w/ painful swallowing. Pt currently on antiviral medication for ongoing thrush. Pt states her throat was better until 4 days ago. Pt also reports right flank pain w/ painful urination that started 4 days ago. NADN

## 2023-12-25 NOTE — ED Provider Notes (Signed)
 [facility] Provider Note    Event Date/Time   First MD Initiated Contact with Patient 12/25/23 0142     (approximate)   History   Sore Throat and Flank Pain   HPI  Gwendolyn Barnes is a 33 y.o. adult transgender female with history of hypertension, asthma, reflux, chronic sciatica who presents to the emergency department with complaints of sore throat for the past few days.  No fevers, cough or congestion.  He is currently finishing oral antifungals for thrush.  Developed thrush after being on steroids for chronic intermittent sore throat and back pain.  No difficulty swallowing, speaking or breathing.  Patient has had multiple episodes of the same with prior ED visits for the same and previous ENT follow-up in Sept 2025 with Dr. Jesus.  Has had negative CT of the soft tissues of the neck 10/03/23 and 11/24/23.  He has had normal TSH 10/29/23 as well.  Patient also having right lower back pain that radiates down the right leg.  No new numbness, tingling, weakness, bowel or bladder incontinence.  No prior back surgery.  Taking over-the-counter medications without much relief.  Denies any injury.  Does feel some discomfort with urination and with wiping but no hematuria, vaginal bleeding or discharge.  No prior history of kidney stone or kidney infection.   History provided by patient.    Past Medical History:  Diagnosis Date   Asthma    Hypertension     History reviewed. No pertinent surgical history.  MEDICATIONS:  Prior to Admission medications   Medication Sig Start Date End Date Taking? Authorizing Provider  albuterol  (VENTOLIN  HFA) 108 (90 Base) MCG/ACT inhaler Inhale 2 puffs into the lungs every 6 (six) hours as needed for wheezing or shortness of breath. 07/21/19   Gwendolyn Dunnings, MD  EPINEPHrine  0.3 mg/0.3 mL IJ SOAJ injection Inject 0.3 mg into the muscle as needed. 10/02/23   Gwendolyn Rover, MD  HYDROcodone  bit-homatropine [facility]) 5-1.5 MG/5ML syrup  Take 5 mLs by mouth every 6 (six) hours as needed for cough. 10/05/23   Gwendolyn Loge, PA-C  hydrOXYzine  (ATARAX ) 25 MG tablet Take 1 tablet (25 mg total) by mouth 3 (three) times daily as needed for anxiety. 10/03/23   Gwendolyn Rover, MD    Physical Exam   Triage Vital Signs: ED Triage Vitals  Encounter Vitals Group     BP 12/25/23 0010 (!) 174/125     Girls Systolic BP Percentile --      Girls Diastolic BP Percentile --      Boys Systolic BP Percentile --      Boys Diastolic BP Percentile --      Pulse Rate 12/25/23 0010 86     Resp 12/25/23 0010 16     Temp 12/25/23 0010 98.6 F (37 C)     Temp Source 12/25/23 0010 Oral     SpO2 12/25/23 0010 99 %     Weight --      Height --      Head Circumference --      Peak Flow --      Pain Score 12/25/23 0012 8     Pain Loc --      Pain Education --      Exclude from Growth Chart --     Most recent vital signs: Vitals:   12/25/23 0010 12/25/23 0348  BP: (!) 174/125 (!) 160/88  Pulse: 86 80  Resp: 16 16  Temp: 98.6 F (37 C)  SpO2: 99% 100%    CONSTITUTIONAL: Alert, responds appropriately to questions. Well-appearing; well-nourished HEAD: Normocephalic, atraumatic EYES: Conjunctivae clear, pupils appear equal, sclera nonicteric ENT: normal nose; moist mucous membranes; No pharyngeal erythema or petechiae, no tonsillar hypertrophy or exudate, no uvular deviation, no unilateral swelling in posterior oropharynx, no trismus or drooling, no muffled voice, normal phonation, no stridor, airway patent.  Swallowing secretions without difficulty. NECK: Supple, normal ROM CARD: RRR; S1 and S2 appreciated RESP: Normal chest excursion without splinting or tachypnea; breath sounds clear and equal bilaterally; no wheezes, no rhonchi, no rales, no hypoxia or respiratory distress, speaking full sentences ABD/GI: Non-distended; soft, tender over the right lumbar paraspinal muscles BACK: The back appears normal, no midline spinal tenderness or  step-off or deformity.  No CVA tenderness. EXT: Normal ROM in all joints; no deformity noted, no edema SKIN: Normal color for age and race; warm; no rash on exposed skin NEURO: Moves all extremities equally, normal speech, no saddle anesthesia, no facial asymmetry, ambulates with normal gait, no clonus noted PSYCH: The patient's mood and manner are appropriate.   ED Results / Procedures / Treatments   LABS: (all labs ordered are listed, but only abnormal results are displayed) Labs Reviewed  URINALYSIS, ROUTINE W REFLEX MICROSCOPIC - Abnormal; Notable for the following components:      Result Value   Color, Urine YELLOW (*)    APPearance HAZY (*)    Protein, ur 30 (*)    All other components within normal limits  BASIC METABOLIC PANEL WITH GFR - Abnormal; Notable for the following components:   Glucose, Bld 107 (*)    All other components within normal limits  HEPATIC FUNCTION PANEL - Abnormal; Notable for the following components:   AST 43 (*)    ALT 50 (*)    All other components within normal limits  GROUP A STREP BY PCR  RESP PANEL BY RT-PCR (RSV, FLU A&B, COVID)  RVPGX2  CBC  LIPASE, BLOOD  POC URINE PREG, ED     EKG:  EKG Interpretation Date/Time:    Ventricular Rate:    PR Interval:    QRS Duration:    QT Interval:    QTC Calculation:   R Axis:      Text Interpretation:           RADIOLOGY: My personal review and interpretation of imaging:    I have personally reviewed all radiology reports.   No results found.   PROCEDURES:  Critical Care performed: No     Procedures    IMPRESSION / MDM / ASSESSMENT AND PLAN / ED COURSE  I reviewed the triage vital signs and the nursing notes.    Patient here for right lower back pain, history of sciatica.  Also complaining of sore throat.  Getting treatment for thrush.  The patient is on the cardiac monitor to evaluate for evidence of arrhythmia and/or significant heart rate  changes.   DIFFERENTIAL DIAGNOSIS (includes but not limited to):   Viral pharyngitis, strep pharyngitis, clinically no signs of tonsillitis or mononucleosis, doubt PTA, deep space neck infection.  No signs clinically of thrush, uvulitis.  Back pain likely musculoskeletal in nature and suggestive of lumbar strain, muscle spasm, lumbar radiculopathy.  No red flag symptoms or injury to suggest fracture, epidural abscess or hematoma, discitis or osteomyelitis, transverse myelitis, cauda equina, critical spinal stenosis   Patient's presentation is most consistent with acute complicated illness / injury requiring diagnostic workup.   PLAN: Strep test negative.  Will obtain COVID, flu and RSV swab.  Lab work unremarkable other than minimally elevated AST, ALT but no abdominal pain here.  Likely from fatty liver disease, obesity.  Also complaining of back pain.  Seems to be musculoskeletal in nature but will check urine.   MEDICATIONS GIVEN IN ED: Medications  HYDROcodone -acetaminophen (NORCO/VICODIN) 5-325 MG per tablet 2 tablet (2 tablets Oral Given 12/25/23 0238)  ibuprofen (ADVIL) tablet 800 mg (800 mg Oral Given 12/25/23 0238)  ondansetron  (ZOFRAN -ODT) disintegrating tablet 4 mg (4 mg Oral Given 12/25/23 0237)     ED COURSE: Urine shows no sign of infection, blood.  Low suspicion for pyelonephritis, kidney stone.  Patient reports some improvement of pain with pain medication here but still uncomfortable.  Have offered further medication here which he declines.  Will discharge with pain medication for home.  Will avoid steroids due to recent diagnosis of thrush.  No indication to continue antifungals any further.  Patient has outpatient follow-up.   At this time, I do not feel there is any life-threatening condition present. I reviewed all nursing notes, vitals, pertinent previous records.  All lab and urine results, EKGs, imaging ordered have been independently reviewed and interpreted by  myself.  I reviewed all available radiology reports from any imaging ordered this visit.  Based on my assessment, I feel the patient is safe to be discharged home without further emergent workup and can continue workup as an outpatient as needed. Discussed all findings, treatment plan as well as usual and customary return precautions.  They verbalize understanding and are comfortable with this plan.  Outpatient follow-up has been provided as needed.  All questions have been answered.    CONSULTS:  none   OUTSIDE RECORDS REVIEWED: I have reviewed prior ENT note on 11/30/2023 where patient was diagnosed with laryngopharyngeal reflux and has had multiple episodes of sore throat, difficulty swallowing.  Has had a CT of the soft tissues of the neck with IV contrast on 10/03/2023 which was normal.       FINAL CLINICAL IMPRESSION(S) / ED DIAGNOSES   Final diagnoses:  Pharyngitis due to other organism  Right sided sciatica     Rx / DC Orders   ED Discharge Orders          Ordered    HYDROcodone -acetaminophen (NORCO/VICODIN) 5-325 MG tablet  Every 8 hours PRN        12/25/23 0345    ibuprofen (ADVIL) 800 MG tablet  Every 8 hours PRN        12/25/23 0345    ondansetron  (ZOFRAN -ODT) 4 MG disintegrating tablet  Every 6 hours PRN        12/25/23 0345    lidocaine (LIDODERM) 5 %  Every 24 hours        12/25/23 0345             Note:  This document was prepared using Dragon voice recognition software and may include unintentional dictation errors.   Ry Moody, Josette SAILOR, DO 12/25/23 [phone]

## 2023-12-25 NOTE — Discharge Instructions (Addendum)
 Your lab work today was reassuring.  Your strep, COVID, flu and RSV test were negative.  Urine showed no blood or sign of infection.   You are being provided a prescription for opiates (also known as narcotics) for pain control.  Opiates can be addictive and should only be used when absolutely necessary for pain control when other alternatives do not work.  We recommend you only use them for the recommended amount of time and only as prescribed.  Please do not take with other sedative medications or alcohol.  Please do not drive, operate machinery, make important decisions while taking opiates.  Please note that these medications can be addictive and have high abuse potential.  Patients can become addicted to narcotics after only taking them for a few days.  Please keep these medications locked away from children, teenagers or any family members with history of substance abuse.  Narcotic pain medicine may also make you constipated.  You may use over-the-counter medications such as MiraLAX, Colace to prevent constipation.  If you become constipated, you may use over-the-counter enemas as needed.  Itching and nausea are also common side effects of narcotic pain medication.  If you develop uncontrolled vomiting or a rash, please stop these medications and seek medical care.

## 2023-12-25 NOTE — ED Notes (Signed)
 Green and lav top tubes and strep swab collected and sent to lab.
